# Patient Record
Sex: Female | Born: 1985 | Race: Black or African American | Hispanic: No | Marital: Single | Smoking: Never smoker
Health system: Southern US, Community
[De-identification: ages and names within clinical notes are randomized; demographics above are authoritative.]

## PROBLEM LIST (undated history)

## (undated) ENCOUNTER — Inpatient Hospital Stay (HOSPITAL_COMMUNITY): Payer: Self-pay

## (undated) ENCOUNTER — Emergency Department (HOSPITAL_COMMUNITY): Payer: Medicaid Other

## (undated) DIAGNOSIS — B373 Candidiasis of vulva and vagina: Secondary | ICD-10-CM

## (undated) DIAGNOSIS — B977 Papillomavirus as the cause of diseases classified elsewhere: Secondary | ICD-10-CM

## (undated) DIAGNOSIS — F419 Anxiety disorder, unspecified: Secondary | ICD-10-CM

## (undated) DIAGNOSIS — F32A Depression, unspecified: Secondary | ICD-10-CM

## (undated) DIAGNOSIS — R51 Headache: Secondary | ICD-10-CM

## (undated) DIAGNOSIS — D696 Thrombocytopenia, unspecified: Secondary | ICD-10-CM

## (undated) DIAGNOSIS — IMO0002 Reserved for concepts with insufficient information to code with codable children: Secondary | ICD-10-CM

## (undated) DIAGNOSIS — A749 Chlamydial infection, unspecified: Secondary | ICD-10-CM

## (undated) DIAGNOSIS — O021 Missed abortion: Secondary | ICD-10-CM

## (undated) DIAGNOSIS — N390 Urinary tract infection, site not specified: Secondary | ICD-10-CM

## (undated) DIAGNOSIS — Z332 Encounter for elective termination of pregnancy: Secondary | ICD-10-CM

## (undated) DIAGNOSIS — N760 Acute vaginitis: Secondary | ICD-10-CM

## (undated) DIAGNOSIS — R87619 Unspecified abnormal cytological findings in specimens from cervix uteri: Secondary | ICD-10-CM

## (undated) DIAGNOSIS — D573 Sickle-cell trait: Secondary | ICD-10-CM

## (undated) DIAGNOSIS — F329 Major depressive disorder, single episode, unspecified: Secondary | ICD-10-CM

## (undated) DIAGNOSIS — B9689 Other specified bacterial agents as the cause of diseases classified elsewhere: Secondary | ICD-10-CM

## (undated) DIAGNOSIS — B3731 Acute candidiasis of vulva and vagina: Secondary | ICD-10-CM

## (undated) HISTORY — PX: TOOTH EXTRACTION: SUR596

## (undated) HISTORY — PX: EYE SURGERY: SHX253

---
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
## 2006-07-17 ENCOUNTER — Other Ambulatory Visit: Admission: RE | Admit: 2006-07-17 | Discharge: 2006-07-17 | Payer: Self-pay | Admitting: Obstetrics & Gynecology

## 2007-01-01 ENCOUNTER — Other Ambulatory Visit: Admission: RE | Admit: 2007-01-01 | Discharge: 2007-01-01 | Payer: Self-pay | Admitting: Obstetrics & Gynecology

## 2007-02-12 ENCOUNTER — Other Ambulatory Visit: Admission: RE | Admit: 2007-02-12 | Discharge: 2007-02-12 | Payer: Self-pay | Admitting: Obstetrics & Gynecology

## 2007-07-25 ENCOUNTER — Other Ambulatory Visit: Admission: RE | Admit: 2007-07-25 | Discharge: 2007-07-25 | Payer: Self-pay | Admitting: Obstetrics and Gynecology

## 2007-11-26 ENCOUNTER — Other Ambulatory Visit: Admission: RE | Admit: 2007-11-26 | Discharge: 2007-11-26 | Payer: Self-pay | Admitting: Obstetrics and Gynecology

## 2008-08-10 ENCOUNTER — Other Ambulatory Visit: Admission: RE | Admit: 2008-08-10 | Discharge: 2008-08-10 | Payer: Self-pay | Admitting: Obstetrics and Gynecology

## 2009-02-16 ENCOUNTER — Ambulatory Visit (HOSPITAL_COMMUNITY): Admission: RE | Admit: 2009-02-16 | Discharge: 2009-02-20 | Payer: Self-pay | Admitting: Obstetrics and Gynecology

## 2009-02-16 ENCOUNTER — Inpatient Hospital Stay (HOSPITAL_COMMUNITY): Admission: AD | Admit: 2009-02-16 | Discharge: 2009-02-20 | Payer: Self-pay | Admitting: Obstetrics and Gynecology

## 2009-10-01 ENCOUNTER — Other Ambulatory Visit: Admission: RE | Admit: 2009-10-01 | Discharge: 2009-10-01 | Payer: Self-pay | Admitting: Obstetrics and Gynecology

## 2009-12-31 ENCOUNTER — Emergency Department (HOSPITAL_COMMUNITY): Admission: EM | Admit: 2009-12-31 | Discharge: 2009-12-31 | Payer: Self-pay | Admitting: Emergency Medicine

## 2010-01-02 ENCOUNTER — Emergency Department (HOSPITAL_COMMUNITY): Admission: EM | Admit: 2010-01-02 | Discharge: 2010-01-02 | Payer: Self-pay | Admitting: Emergency Medicine

## 2010-01-31 ENCOUNTER — Emergency Department (HOSPITAL_COMMUNITY): Admission: EM | Admit: 2010-01-31 | Discharge: 2010-01-31 | Payer: Self-pay | Admitting: Emergency Medicine

## 2010-02-04 ENCOUNTER — Emergency Department (HOSPITAL_COMMUNITY): Admission: EM | Admit: 2010-02-04 | Discharge: 2010-02-04 | Payer: Self-pay | Admitting: Emergency Medicine

## 2010-09-05 ENCOUNTER — Encounter: Payer: Self-pay | Admitting: Obstetrics and Gynecology

## 2010-10-04 ENCOUNTER — Other Ambulatory Visit: Payer: Self-pay | Admitting: Obstetrics and Gynecology

## 2010-10-04 ENCOUNTER — Other Ambulatory Visit (HOSPITAL_COMMUNITY)
Admission: RE | Admit: 2010-10-04 | Discharge: 2010-10-04 | Disposition: A | Discharge status: Home / Self Care | Payer: Self-pay | Source: Ambulatory Visit | Attending: Obstetrics and Gynecology | Admitting: Obstetrics and Gynecology

## 2010-10-04 DIAGNOSIS — Z113 Encounter for screening for infections with a predominantly sexual mode of transmission: Secondary | ICD-10-CM | POA: Insufficient documentation

## 2010-10-04 DIAGNOSIS — Z01419 Encounter for gynecological examination (general) (routine) without abnormal findings: Secondary | ICD-10-CM | POA: Insufficient documentation

## 2010-11-20 LAB — CBC
HCT: 28.8 % — ABNORMAL LOW (ref 36.0–46.0)
HCT: 34 % — ABNORMAL LOW (ref 36.0–46.0)
Hemoglobin: 11.6 g/dL — ABNORMAL LOW (ref 12.0–15.0)
Hemoglobin: 12.1 g/dL (ref 12.0–15.0)
Hemoglobin: 9.9 g/dL — ABNORMAL LOW (ref 12.0–15.0)
MCHC: 34.1 g/dL (ref 30.0–36.0)
MCHC: 34.2 g/dL (ref 30.0–36.0)
MCHC: 34.3 g/dL (ref 30.0–36.0)
MCHC: 34.3 g/dL (ref 30.0–36.0)
MCV: 83.9 fL (ref 78.0–100.0)
MCV: 84.1 fL (ref 78.0–100.0)
MCV: 84.1 fL (ref 78.0–100.0)
Platelets: 115 10*3/uL — ABNORMAL LOW (ref 150–400)
Platelets: 120 10*3/uL — ABNORMAL LOW (ref 150–400)
RBC: 3.42 MIL/uL — ABNORMAL LOW (ref 3.87–5.11)
RBC: 4.04 MIL/uL (ref 3.87–5.11)
RBC: 4.24 MIL/uL (ref 3.87–5.11)
RDW: 14.1 % (ref 11.5–15.5)
RDW: 14.3 % (ref 11.5–15.5)
RDW: 14.4 % (ref 11.5–15.5)
RDW: 14.7 % (ref 11.5–15.5)
WBC: 8.4 10*3/uL (ref 4.0–10.5)

## 2010-11-20 LAB — COMPREHENSIVE METABOLIC PANEL
ALT: 14 U/L (ref 0–35)
AST: 24 U/L (ref 0–37)
Alkaline Phosphatase: 153 U/L — ABNORMAL HIGH (ref 39–117)
Calcium: 8.9 mg/dL (ref 8.4–10.5)
GFR calc Af Amer: >60 mL/min (ref >60)
Potassium: 3.7 mEq/L (ref 3.5–5.1)
Sodium: 136 mEq/L (ref 135–145)
Total Protein: 5.9 g/dL — ABNORMAL LOW (ref 6.0–8.3)

## 2010-11-20 LAB — RPR: RPR Ser Ql: NONREACTIVE
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
## 2010-12-27 NOTE — Discharge Summary (Signed)
NAMEANYELI, [name]            ACCOUNT NO.:  [id number]   MEDICAL RECORD NO.:  [id number]          PATIENT TYPE:  INP   LOCATION:  9120                          [facility]ACILITY:  [facility]   PHYSICIAN:  Gerald Leitz, MD          DATE OF BIRTH:  09-26-1985   DATE OF ADMISSION:  02/16/2009  DATE OF DISCHARGE:  02/20/2009                               DISCHARGE SUMMARY   ADMISSION DIAGNOSES:  1. 37-week 2-day intrauterine pregnancy.  2. Oligohydramnios.   DISCHARGE DIAGNOSES:  1. 37-week 2-day intrauterine pregnancy.  2. Oligohydramnios.  3. Thrombocytopenia, most likely gestational.  4. Status post low transverse cesarean section.  5. Nuchal cord x1.  6. Nonreassuring fetal heart rate.   BRIEF HOSPITAL COURSE:  The patient was admitted at the office on February 16, 2009, 37 weeks 2 days intrauterine pregnancy with oligohydramnios.  She received Cervidil for induction.  Once Cervidil was completed, she  was started on Pitocin.  Nonreassuring fetal heart rate was consented  for cesarean section, delivered a live born female infant with Apgar of 6  and 7 and 1 at 5 minutes respectively.  Arterial cord pH of 7.23.  The  infant's weight was 6 pounds 4 ounces.  The patient did well  postoperatively.  Her platelets are decreased slightly to 94 on  admission and were up to 115.  Hemoglobin was 10.6.  She is discharged  home on postop day #3 in stable and improved condition on the following  medications Motrin, Percocet, and Micronor.  She is to follow up in our  office on February 22, 2009, for staple removal and in 6 weeks for  postpartum visit.      Gerald Leitz, MD  Electronically Signed     TC/MEDQ  D:  02/20/2009  T:  02/20/2009  Job:  [id number]

## 2010-12-27 NOTE — Op Note (Signed)
[name], [name]            ACCOUNT NO.:  [id number]   MEDICAL RECORD NO.:  [id number]          PATIENT TYPE:  INP   LOCATION:  9120                          [facility]ILITY[facility]  [facility]   PHYSICIAN:  Gerald Leitz, MD          DATE OF BIRTH:  26-Oct-1985   DATE OF PROCEDURE:  02/17/2009  DATE OF DISCHARGE:                               OPERATIVE REPORT   PREOPERATIVE DIAGNOSES:  1. 37-week 2-day intrauterine pregnancy.  2. Oligohydramnios.  3. Nonreassuring fetal heart rate.  4. Thrombocytopenia.   POSTOPERATIVE DIAGNOSES:  1. 37-week 2-day intrauterine pregnancy.  2. Oligohydramnios.  3. Nonreassuring fetal heart rate.  4. Thrombocytopenia.  5. Nuchal cord x1.  6. Status post low transverse cesarean section.   PROCEDURE:  Low transverse cesarean section.   SURGEON:  Gerald Leitz, MD   ASSISTANT:  None.   ANESTHESIA:  Spinal.   FINDINGS:  Female infant, cephalic presentation, Apgars of 6 and 7 and 1  at 5 minutes respectively.  Arterial cord pH of 7123, weight of 6 pounds  4 ounces.   SPECIMEN:  Placenta.   DISPOSITION:  Specimen to Pathology.   ESTIMATED BLOOD LOSS:  700 mL.   URINE OUTPUT:  300 mL.   FLUIDS:  2500 mL.   COMPLICATIONS:  None.   DESCRIPTION OF PROCEDURE:  The patient was taken to the operating room  where she received spinal anesthesia.  She was prepped and draped in the  usual sterile fashion.  Pfannenstiel skin incision was made with  scalpel, carried down to the underlying layer of fascia.  Fascia was  incised in the midline, incision was extended laterally with Mayo  scissors.  Superior aspect of the fascial incision was grasped with  Kocher clamps, elevated, underlying rectus muscles were dissected with  Mayo scissors.  Superior aspect of fascial incision was grasped with  Kocher clamps, underlying rectus muscle were dissected with Mayo  scissors.  The rectus muscle was separated in the midline.  The  peritoneum was identified and entered bluntly.   Alexis retractor was  placed into the peritoneal cavity.  The vesicouterine peritoneum was  grasped with Russians, tented up, and entered sharply with Metzenbaum  scissors.  The incision was extended laterally and that bladder flap was  created digitally.  Low transverse incision was made with a scalpel.  Infant's head was removed delivered.  Mouth and nose were bulb  suctioned.  Nuchal cord x was reduced, the anterior shoulder and rest  body were delivered.  Cord was clamped x2 and cut.  The infant was  handed off to the awaiting pediatricians.  The placenta was expressed.  The uterus was exteriorized, cleared of all clot and debris.  Uterine  incision was repaired with 0 Vicryl in a running locked fashion.  Second  layer of same suture was used for excellent hemostasis.  Uterus was then  returned to the abdomen.  The abdomen was copiously irrigated.  Excellent hemostasis was noted.  The Alexis retractor was removed.  The  fascia was closed with 0 PDS.  The skin was closed with staples.  Sponge, lap, and needle counts were correct x2.  Two grams of Ancef were  given at cord clamp.  The patient was taken to the recovery room awake  and in stable condition.      Gerald Leitz, MD  Electronically Signed     TC/MEDQ  D:  02/17/2009  T:  02/18/2009  Job:  [id number]

## 2011-02-05 ENCOUNTER — Emergency Department (HOSPITAL_COMMUNITY)
Admission: EM | Admit: 2011-02-05 | Discharge: 2011-02-05 | Disposition: A | Discharge status: Home / Self Care | Payer: Medicaid Other | Source: Home / Self Care | Attending: Emergency Medicine | Admitting: Emergency Medicine

## 2011-02-05 ENCOUNTER — Inpatient Hospital Stay (HOSPITAL_COMMUNITY)
Admission: AD | Admit: 2011-02-05 | Discharge: 2011-02-05 | Disposition: A | Discharge status: Home / Self Care | Payer: Medicaid Other | Source: Ambulatory Visit | Attending: Obstetrics & Gynecology | Admitting: Obstetrics & Gynecology

## 2011-02-05 DIAGNOSIS — N898 Other specified noninflammatory disorders of vagina: Secondary | ICD-10-CM | POA: Insufficient documentation

## 2011-02-05 DIAGNOSIS — B9689 Other specified bacterial agents as the cause of diseases classified elsewhere: Secondary | ICD-10-CM | POA: Insufficient documentation

## 2011-02-05 DIAGNOSIS — R109 Unspecified abdominal pain: Secondary | ICD-10-CM | POA: Insufficient documentation

## 2011-02-05 DIAGNOSIS — O99891 Other specified diseases and conditions complicating pregnancy: Secondary | ICD-10-CM | POA: Insufficient documentation

## 2011-02-05 DIAGNOSIS — N760 Acute vaginitis: Secondary | ICD-10-CM

## 2011-02-05 DIAGNOSIS — A499 Bacterial infection, unspecified: Secondary | ICD-10-CM | POA: Insufficient documentation

## 2011-02-05 DIAGNOSIS — J3489 Other specified disorders of nose and nasal sinuses: Secondary | ICD-10-CM | POA: Insufficient documentation

## 2011-02-05 DIAGNOSIS — J45909 Unspecified asthma, uncomplicated: Secondary | ICD-10-CM | POA: Insufficient documentation

## 2011-02-05 DIAGNOSIS — O2390 Unspecified genitourinary tract infection in pregnancy, unspecified trimester: Secondary | ICD-10-CM | POA: Insufficient documentation

## 2011-02-05 LAB — URINALYSIS, ROUTINE W REFLEX MICROSCOPIC
Bilirubin Urine: NEGATIVE
Ketones, ur: NEGATIVE mg/dL
Nitrite: NEGATIVE
Protein, ur: NEGATIVE mg/dL
pH: 5.5 (ref 5.0–8.0)

## 2011-02-05 LAB — WET PREP, GENITAL

## 2011-02-05 LAB — URINE MICROSCOPIC-ADD ON

## 2011-02-05 LAB — POCT PREGNANCY, URINE: Preg Test, Ur: POSITIVE

## 2011-02-07 LAB — GC/CHLAMYDIA PROBE AMP, GENITAL: GC Probe Amp, Genital: NEGATIVE

## 2011-02-16 LAB — CBC
HCT: 31 % — AB (ref 36–46)
Hemoglobin: 10.5 g/dL — AB (ref 12.0–16.0)
Platelets: 218 K/µL (ref 150–399)

## 2011-02-16 LAB — ANTIBODY SCREEN: Antibody Screen: NEGATIVE

## 2011-02-16 LAB — RPR: RPR: NONREACTIVE

## 2011-02-16 LAB — ABO/RH

## 2011-02-16 LAB — RUBELLA ANTIBODY, IGM: Rubella: IMMUNE

## 2011-02-16 LAB — HIV ANTIBODY (ROUTINE TESTING W REFLEX): HIV: NONREACTIVE

## 2011-02-16 LAB — HEPATITIS B SURFACE ANTIGEN: Hepatitis B Surface Ag: NEGATIVE

## 2011-05-07 ENCOUNTER — Inpatient Hospital Stay (HOSPITAL_COMMUNITY)
Admission: AD | Admit: 2011-05-07 | Discharge: 2011-05-07 | Disposition: A | Discharge status: Home / Self Care | Payer: Medicaid Other | Source: Ambulatory Visit | Attending: Obstetrics and Gynecology | Admitting: Obstetrics and Gynecology

## 2011-05-07 ENCOUNTER — Encounter (HOSPITAL_COMMUNITY): Payer: Self-pay | Admitting: *Deleted

## 2011-05-07 DIAGNOSIS — N390 Urinary tract infection, site not specified: Secondary | ICD-10-CM

## 2011-05-07 DIAGNOSIS — O4690 Antepartum hemorrhage, unspecified, unspecified trimester: Secondary | ICD-10-CM

## 2011-05-07 DIAGNOSIS — B373 Candidiasis of vulva and vagina: Secondary | ICD-10-CM

## 2011-05-07 DIAGNOSIS — B3731 Acute candidiasis of vulva and vagina: Secondary | ICD-10-CM | POA: Insufficient documentation

## 2011-05-07 DIAGNOSIS — O2390 Unspecified genitourinary tract infection in pregnancy, unspecified trimester: Secondary | ICD-10-CM | POA: Insufficient documentation

## 2011-05-07 DIAGNOSIS — O26859 Spotting complicating pregnancy, unspecified trimester: Secondary | ICD-10-CM | POA: Insufficient documentation

## 2011-05-07 HISTORY — DX: Urinary tract infection, site not specified: N39.0

## 2011-05-07 HISTORY — DX: Candidiasis of vulva and vagina: B37.3

## 2011-05-07 HISTORY — DX: Acute candidiasis of vulva and vagina: B37.31

## 2011-05-07 HISTORY — DX: Other specified bacterial agents as the cause of diseases classified elsewhere: B96.89

## 2011-05-07 HISTORY — DX: Other specified bacterial agents as the cause of diseases classified elsewhere: N76.0

## 2011-05-07 LAB — WET PREP, GENITAL

## 2011-05-07 LAB — AMNISURE RUPTURE OF MEMBRANE (ROM) NOT AT ARMC: Amnisure ROM: NEGATIVE

## 2011-05-07 MED ORDER — FLUCONAZOLE 150 MG PO TABS: 150.0000 mg | ORAL_TABLET | Freq: Once | ORAL | Status: AC

## 2011-05-07 NOTE — Progress Notes (Signed)
Intercourse today with pink d/c after.  Has recently been treated for BV, then yeast, then UTI, states that is a common pattern for her.
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
## 2011-05-07 NOTE — Progress Notes (Signed)
Pt G2 P1 at 22.3wks, having small amt of light pink bleeding and vaginal irritation.  Pt denies pain.

## 2011-05-29 ENCOUNTER — Encounter (HOSPITAL_COMMUNITY): Payer: Self-pay | Admitting: *Deleted

## 2011-05-29 ENCOUNTER — Inpatient Hospital Stay (HOSPITAL_COMMUNITY)
Admission: AD | Admit: 2011-05-29 | Discharge: 2011-05-29 | Disposition: A | Discharge status: Home / Self Care | Payer: Medicaid Other | Source: Ambulatory Visit | Attending: Obstetrics and Gynecology | Admitting: Obstetrics and Gynecology

## 2011-05-29 ENCOUNTER — Other Ambulatory Visit: Payer: Self-pay

## 2011-05-29 DIAGNOSIS — O99019 Anemia complicating pregnancy, unspecified trimester: Secondary | ICD-10-CM | POA: Insufficient documentation

## 2011-05-29 DIAGNOSIS — J45901 Unspecified asthma with (acute) exacerbation: Secondary | ICD-10-CM

## 2011-05-29 DIAGNOSIS — D649 Anemia, unspecified: Secondary | ICD-10-CM | POA: Insufficient documentation

## 2011-05-29 DIAGNOSIS — Z889 Allergy status to unspecified drugs, medicaments and biological substances status: Secondary | ICD-10-CM

## 2011-05-29 DIAGNOSIS — O99891 Other specified diseases and conditions complicating pregnancy: Secondary | ICD-10-CM | POA: Insufficient documentation

## 2011-05-29 DIAGNOSIS — J45909 Unspecified asthma, uncomplicated: Secondary | ICD-10-CM | POA: Insufficient documentation

## 2011-05-29 DIAGNOSIS — R079 Chest pain, unspecified: Secondary | ICD-10-CM | POA: Insufficient documentation

## 2011-05-29 HISTORY — DX: Depression, unspecified: F32.A

## 2011-05-29 HISTORY — DX: Major depressive disorder, single episode, unspecified: F32.9

## 2011-05-29 LAB — CBC
Hemoglobin: 8.9 g/dL — ABNORMAL LOW (ref 12.0–15.0)
RBC: 3.58 MIL/uL — ABNORMAL LOW (ref 3.87–5.11)

## 2011-05-29 LAB — URINALYSIS, ROUTINE W REFLEX MICROSCOPIC
Bilirubin Urine: NEGATIVE
Glucose, UA: NEGATIVE mg/dL
Leukocytes, UA: NEGATIVE
Nitrite: NEGATIVE
Specific Gravity, Urine: 1.02 (ref 1.005–1.030)
pH: 7 (ref 5.0–8.0)

## 2011-05-29 LAB — DIFFERENTIAL
Lymphs Abs: 1.2 10*3/uL (ref 0.7–4.0)
Monocytes Relative: 4 % (ref 3–12)
Neutro Abs: 8.7 10*3/uL — ABNORMAL HIGH (ref 1.7–7.7)
Neutrophils Relative %: 83 % — ABNORMAL HIGH (ref 43–77)

## 2011-05-29 MED ORDER — RANITIDINE HCL 150 MG/10ML PO SYRP: 150.0000 mg | ORAL_SOLUTION | Freq: Once | ORAL | Status: DC

## 2011-05-29 MED ORDER — LEVALBUTEROL HCL 0.63 MG/3ML IN NEBU
0.6300 mg | INHALATION_SOLUTION | Freq: Once | RESPIRATORY_TRACT | Status: AC
  Administered 2011-05-29: 0.63 mg via RESPIRATORY_TRACT
  Filled 2011-05-29: qty 3

## 2011-05-29 MED ORDER — INTEGRA PLUS PO CAPS: 1.0000 | ORAL_CAPSULE | Freq: Every day | ORAL | Status: DC

## 2011-05-29 MED ORDER — MONTELUKAST SODIUM 10 MG PO TABS: 10.0000 mg | ORAL_TABLET | Freq: Every day | ORAL | Status: DC

## 2011-05-29 MED ORDER — IBUPROFEN 800 MG PO TABS
800.0000 mg | ORAL_TABLET | Freq: Once | ORAL | Status: AC
  Administered 2011-05-29: 800 mg via ORAL
  Filled 2011-05-29: qty 1

## 2011-05-29 MED ORDER — FAMOTIDINE 20 MG PO TABS
20.0000 mg | ORAL_TABLET | Freq: Once | ORAL | Status: AC
  Administered 2011-05-29: 20 mg via ORAL
  Filled 2011-05-29: qty 1

## 2011-05-29 MED ORDER — DIPHENHYDRAMINE HCL 25 MG PO CAPS: 25.0000 mg | ORAL_CAPSULE | Freq: Four times a day (QID) | ORAL | Status: DC | PRN

## 2011-05-29 MED ORDER — LORATADINE 10 MG PO TABS
10.0000 mg | ORAL_TABLET | Freq: Every day | ORAL | Status: DC
  Administered 2011-05-29: 10 mg via ORAL
  Filled 2011-05-29 (×3): qty 1

## 2011-05-29 NOTE — ED Notes (Signed)
Pt is allergic to pet fur and has been living with the FOB's grandmother and she has a small indoor dog; c/o head congestion for past month and has been exposed to the dog for approximately that same amount of time;

## 2011-05-29 NOTE — Progress Notes (Signed)
C/o chest pain and SOB this Am at school; ? Asthma attack:; head congestion for past month;
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
## 2011-06-07 ENCOUNTER — Encounter (HOSPITAL_COMMUNITY): Payer: Self-pay | Admitting: *Deleted

## 2011-06-07 ENCOUNTER — Inpatient Hospital Stay (HOSPITAL_COMMUNITY)
Admission: AD | Admit: 2011-06-07 | Discharge: 2011-06-07 | Disposition: A | Discharge status: Home / Self Care | Payer: Medicaid Other | Source: Ambulatory Visit | Attending: Obstetrics and Gynecology | Admitting: Obstetrics and Gynecology

## 2011-06-07 ENCOUNTER — Inpatient Hospital Stay (HOSPITAL_COMMUNITY): Payer: Medicaid Other

## 2011-06-07 ENCOUNTER — Other Ambulatory Visit: Payer: Self-pay | Admitting: Obstetrics and Gynecology

## 2011-06-07 DIAGNOSIS — O99891 Other specified diseases and conditions complicating pregnancy: Secondary | ICD-10-CM | POA: Insufficient documentation

## 2011-06-07 DIAGNOSIS — O4690 Antepartum hemorrhage, unspecified, unspecified trimester: Secondary | ICD-10-CM

## 2011-06-07 DIAGNOSIS — R319 Hematuria, unspecified: Secondary | ICD-10-CM | POA: Insufficient documentation

## 2011-06-07 HISTORY — DX: Thrombocytopenia, unspecified: D69.6

## 2011-06-07 LAB — URINE MICROSCOPIC-ADD ON

## 2011-06-07 LAB — CBC
MCH: 24.8 pg — ABNORMAL LOW (ref 26.0–34.0)
MCHC: 32.7 g/dL (ref 30.0–36.0)
Platelets: 164 10*3/uL (ref 150–400)
RDW: 16 % — ABNORMAL HIGH (ref 11.5–15.5)

## 2011-06-07 LAB — URINALYSIS, ROUTINE W REFLEX MICROSCOPIC
Ketones, ur: NEGATIVE mg/dL
Nitrite: NEGATIVE
Protein, ur: NEGATIVE mg/dL
Urobilinogen, UA: 0.2 mg/dL (ref 0.0–1.0)
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
## 2011-06-07 NOTE — Progress Notes (Signed)
Patient states that at 0430 this am she went to the bathroom and had blood on the tissue when wiping and when she stood up bright red blood ran down her legs. Has continued to have bleeding when urinating. Has pain with urination that started the same time this am. Patient states she has had a cold for 2 days with a non-productive cough.

## 2011-06-08 LAB — URINE CULTURE: Colony Count: NO GROWTH

## 2011-06-08 LAB — GC/CHLAMYDIA PROBE AMP, GENITAL: GC Probe Amp, Genital: NEGATIVE

## 2011-06-09 ENCOUNTER — Inpatient Hospital Stay (HOSPITAL_COMMUNITY)
Admission: AD | Admit: 2011-06-09 | Discharge: 2011-06-09 | Disposition: A | Discharge status: Home / Self Care | Payer: Medicaid Other | Source: Ambulatory Visit | Attending: Obstetrics and Gynecology | Admitting: Obstetrics and Gynecology

## 2011-06-09 DIAGNOSIS — D649 Anemia, unspecified: Secondary | ICD-10-CM | POA: Diagnosis present

## 2011-06-09 DIAGNOSIS — O4690 Antepartum hemorrhage, unspecified, unspecified trimester: Secondary | ICD-10-CM | POA: Insufficient documentation

## 2011-06-09 DIAGNOSIS — B3731 Acute candidiasis of vulva and vagina: Secondary | ICD-10-CM | POA: Insufficient documentation

## 2011-06-09 DIAGNOSIS — B373 Candidiasis of vulva and vagina: Secondary | ICD-10-CM | POA: Insufficient documentation

## 2011-06-09 DIAGNOSIS — O2390 Unspecified genitourinary tract infection in pregnancy, unspecified trimester: Secondary | ICD-10-CM | POA: Insufficient documentation

## 2011-06-09 DIAGNOSIS — Z98891 History of uterine scar from previous surgery: Secondary | ICD-10-CM

## 2011-06-09 MED ORDER — PHENAZOPYRIDINE HCL 100 MG PO TABS
200.0000 mg | ORAL_TABLET | Freq: Once | ORAL | Status: AC
  Administered 2011-06-09: 200 mg via ORAL
  Filled 2011-06-09: qty 2

## 2011-06-09 MED ORDER — PHENAZOPYRIDINE HCL 200 MG PO TABS: 200.0000 mg | ORAL_TABLET | Freq: Three times a day (TID) | ORAL | Status: AC

## 2011-06-09 NOTE — ED Provider Notes (Signed)
History     Chief Complaint  Patient presents with  . Vaginal Bleeding  . Dysuria   HPI Comments: Pt is a G4P1 at [redacted]w[redacted]d with c/o urinary burning and pain. And spotting. Pt was seen here 2 days ago and was evaluated for bleeding with negative results. Pt received a catheter to check UA. Pt denies ctx, heavy VB or LOF, +FM.   Vaginal Bleeding Associated symptoms include dysuria.  Dysuria       Past Medical History  Diagnosis Date  . Bacterial vaginosis   . Yeast vaginitis   . UTI (urinary tract infection)   . Asthma   . Depression   . Thrombocytopenia   . Sickle cell disease   . Preterm labor     Past Surgical History  Procedure Date  . Cesarean section   . Eye surgery   . Eye surgery     No family history on file.  History  Substance Use Topics  . Smoking status: Never Smoker   . Smokeless tobacco: Never Used  . Alcohol Use: No    Allergies:  Allergies  Allergen Reactions  . Sulfa Antibiotics Hives and Itching    Prescriptions prior to admission  Medication Sig Dispense Refill  . FeFum-FePoly-FA-B Cmp-C-Biot (INTEGRA PLUS) CAPS Take 1 capsule by mouth daily.  30 capsule  12  . montelukast (SINGULAIR) 10 MG tablet Take 1 tablet (10 mg total) by mouth at bedtime.  30 tablet  12  . prenatal vitamin w/FE, FA (PRENATAL 1 + 1) 27-1 MG TABS Take 1 tablet by mouth daily.        . Probiotic Product (PROBIOTIC FORMULA PO) Take 1 capsule by mouth daily.          Review of Systems  Genitourinary: Positive for dysuria and vaginal bleeding.  All other systems reviewed and are negative.   Physical Exam   Blood pressure 119/59, pulse 78, temperature 97.5 F (36.4 C), temperature source Oral, resp. rate 18, height 5' 4.75" (1.645 m), weight 69.174 kg (152 lb 8 oz).  Physical Exam  Nursing note and vitals reviewed. Constitutional: She is oriented to person, place, and time. She appears well-developed and well-nourished.  Cardiovascular: Normal rate.     Respiratory: Effort normal.  GI: Soft.  Genitourinary: Vaginal discharge found.       White, thick, adherent DC, pt using vag suppository and cream secondary to dx of yeast infection    Musculoskeletal: Normal range of motion.  Neurological: She is alert and oriented to person, place, and time.  Skin: Skin is warm and dry.  Psychiatric: She has a normal mood and affect. Her behavior is normal.    MAU Course  Procedures    Assessment and Plan  IUP at 27wks Yeast infection Likely irritation from catheter  D/C home with pyridium  Manasvini Whatley M 06/09/2011, 11:24 PM

## 2011-06-09 NOTE — Progress Notes (Signed)
"  It hurts down there to urinate.  I think I'm having urinary bleeding.  I have a yeast infection now too.  I have had either BV, yeast and urinary tract infections throughout this pregnancy."

## 2011-06-09 NOTE — Progress Notes (Signed)
Pt states, " I was seen here on 10/24 by Chip Boer. I was having bleeding but she couldn't tell if it was from my vagina or my bladder. Today I started having bleeding again, and it hurts real bad when I pee."

## 2011-06-12 LAB — CULTURE, BETA STREP (GROUP B ONLY): Special Requests: NORMAL

## 2011-07-07 ENCOUNTER — Telehealth: Payer: Self-pay | Admitting: Oncology

## 2011-07-07 NOTE — Telephone Encounter (Signed)
Moved 11/29 new pt appt from DM to FS due to DM overbooked w/2 new pt's. S/w pt today re changed + gv her new time for 11/29 @ 1:30 pm w/FS.

## 2011-07-11 ENCOUNTER — Other Ambulatory Visit: Payer: Self-pay | Admitting: Obstetrics and Gynecology

## 2011-07-12 ENCOUNTER — Encounter: Payer: Self-pay | Admitting: *Deleted

## 2011-07-12 NOTE — Progress Notes (Signed)
This encounter was created in error - please disregard.

## 2011-07-13 ENCOUNTER — Ambulatory Visit: Payer: Medicaid Other

## 2011-07-13 ENCOUNTER — Encounter: Payer: Self-pay | Admitting: *Deleted

## 2011-07-13 ENCOUNTER — Other Ambulatory Visit: Payer: Medicaid Other | Admitting: Lab

## 2011-07-13 ENCOUNTER — Other Ambulatory Visit (HOSPITAL_BASED_OUTPATIENT_CLINIC_OR_DEPARTMENT_OTHER): Payer: Medicaid Other | Admitting: Lab

## 2011-07-13 ENCOUNTER — Encounter: Payer: Medicaid Other | Admitting: Oncology

## 2011-07-13 ENCOUNTER — Ambulatory Visit: Payer: Medicaid Other | Admitting: Oncology

## 2011-07-13 DIAGNOSIS — D649 Anemia, unspecified: Secondary | ICD-10-CM

## 2011-07-13 LAB — CBC & DIFF AND RETIC
Basophils Absolute: 0 10*3/uL (ref 0.0–0.1)
EOS%: 1.9 % (ref 0.0–7.0)
Eosinophils Absolute: 0.2 10*3/uL (ref 0.0–0.5)
HGB: 8.3 g/dL — ABNORMAL LOW (ref 11.6–15.9)
LYMPH%: 14.4 % (ref 14.0–49.7)
MCH: 22.9 pg — ABNORMAL LOW (ref 25.1–34.0)
MCV: 71 fL — ABNORMAL LOW (ref 79.5–101.0)
MONO%: 4.6 % (ref 0.0–14.0)
NEUT#: 6.9 10*3/uL — ABNORMAL HIGH (ref 1.5–6.5)
Platelets: 169 10*3/uL (ref 145–400)
RBC: 3.62 10*6/uL — ABNORMAL LOW (ref 3.70–5.45)
RDW: 16.9 % — ABNORMAL HIGH (ref 11.2–14.5)
Retic Ct Abs: 85.43 10*3/uL (ref 33.70–90.70)

## 2011-07-13 LAB — IRON AND TIBC
%SAT: 3 % — ABNORMAL LOW (ref 20–55)
Iron: 14 ug/dL — ABNORMAL LOW (ref 42–145)
TIBC: 466 ug/dL (ref 250–470)
UIBC: 452 ug/dL — ABNORMAL HIGH (ref 125–400)

## 2011-07-13 LAB — CHCC SMEAR

## 2011-07-13 NOTE — Progress Notes (Signed)
Addended by: Reesa Chew on: 07/13/2011 03:14 PM   Modules accepted: Kipp Brood

## 2011-07-13 NOTE — Progress Notes (Signed)
Addended by: Benjiman Core on: 07/13/2011 03:04 PM   Modules accepted: Orders, Level of Service

## 2011-07-13 NOTE — Progress Notes (Signed)
Note dictated

## 2011-07-14 NOTE — Progress Notes (Signed)
CC:   Osborn Coho, M.D.  REASON FOR CONSULTATION:  Anemia.  HISTORY OF PRESENT ILLNESS:  This is a pleasant 25 year old [place] American female, native of [place]. She is a relatively healthy woman, currently in a 2nd pregnancy.  Her 1st pregnancy about 2 years ago was. She gave birth without any complication. She was mildly anemic at that time with a hemoglobin around 10-11.  She had normal MCV, really no complications to speak of.  Now she is about 8 months in her 2nd pregnancy and has noted to be more progressively anemic at this time. Her most recent laboratory data on 10/24 showed a hemoglobin was 8.4, white count of 8.5, platelet count 164, and MCV was 75, and her hemoglobin was around between 9 and 10 in July of 2010, as mentioned. She although had not been known to have any hemoglobinopathies. She does reportedly have had sickle cell trait.  In terms of her pregnancy at this time, she is not reporting any major complication. She is quite a bit fatigued at times.  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 She is caring for her 59-year-old child and able to maintain her activities of daily living without any major issues. She is not actually reporting any bleeding.  She is not  reporting any hematochezia. Not reporting any melena.  Not reporting any epistaxis. Not reporting any chest pain. Not reporting any shortness of breath.  REVIEW OF SYSTEMS:  Not reporting any headaches, blurred vision, or double vision. Not reporting any motor or sensory neuropathy. Not reporting any alteration in mental status. Not reporting any psychiatric issues, depression. Not reporting any fever, chills, or sweats. Not reporting any cough, hemoptysis, hematemesis, nausea or vomiting, abdominal pain, hematochezia, or melena.  The rest of the review of systems is unremarkable.  PAST MEDICAL HISTORY:  Is significant for history of anemia.  She has history of asthma, allergy.  She  has a history of cesarean section.  MEDICATIONS:  She is on Integra plus. She is on Singulair, on prenatal vitamins, and probiotics.  ALLERGIES:  To sulfa.  SOCIAL HISTORY:  She is single.  She lives with her mother and her child. Denied any alcohol or tobacco abuse.  FAMILY HISTORY:  Emelia Loron has sickle cell.  She is a sickle cell carrier.  PHYSICAL EXAMINATION:  General: Alert, awake, and pleasant woman appeared in no active distress.  Vital signs: Her blood pressure is 109/64, pulse 96, respiration 20, and temperature is 97.6.  HEENT:  Head is normocephalic, atraumatic.  Pupils are equal, round, and reactive to light.  Oral mucosa moist and pink.  Neck:  Supple without lymphadenopathy.  Heart is regular rate and rhythm.  S1 and S2.  Lungs are clear auscultation.  Abdomen is soft, nontender.  No hepatosplenomegaly.  Extremities: No clubbing, cyanosis, or edema. Neurologically intact motor, sensory, and deep tendon reflexes.  LABORATORY DATA:  Showed a hemoglobin of 8.3, white cell count of 8.8, and platelet count 169.  Her MCV is 71, RDW 16.9.  Peripheral smear was personally reviewed today and showed evidence of microcytosis and hypochromia.  Could not really see any evidence of any schistocytosis or red cell fragmentation.  There is no evidence of any target cells to suggest a hemoglobinopathy.  ASSESSMENT AND PLAN:  A 25 year old woman with the following issues: 1. Microcytic hypochromic anemia. Undoubtedly, she has an element of     iron deficiency.  I am checking her iron stores today with  ferritin     and iron levels, and certainly that will document that, but I would     be surprised if this is not all iron deficiency.  She does report     heavy menstrual cycles plus her pregnancy state, I think couple     that to the fact that her 2nd pregnancy she probably was deficient     from iron the 1st pregnancy, all of that culminates to have her     hemoglobin 8.3.  I  discussed the treatment options at this point.     She is on iron supplement through the mouth, which I think is     inadequate at this time.  She is reporting some constipation     associated with it, and I advised her to use stool softeners as a     way to circumvent that. The option of IV iron was discussed     thoroughly today with Ms. Madilyn Fireman. I feel that the safest formulation     would be IV Venofer, but even with that there is always a risk of     infusion-related toxicity such as an allergic reaction, possible     anaphylaxis which could put her fetus in jeopardy. At this time, I     feel that although her degree of anemia is significant, I think she     is relatively asymptomatic from it.  I feel that from my     standpoint, the risk of IV infusion outweighs the potential     benefit.  However, if her anemia does progress further there is     always a possibility of doing packed red cell transfusion, in     anticipation of her C-section which is being scheduled for     somewhere in January 2013.  At this time, I felt whether it is IV     iron or a packed red cell transfusion is needed by her primary     OB/GYN, I think those are both reasonable options. Again, I favor     doing neither but if needed to, I think I would go with packed red     cell transfusion as an easier and probably safer option and could     be done in preparation for her C-section or on the day of.  I also     talked to Ms. Gentzler about a followup after she gave birth, and I     will remeasure her iron stores at that time and certainly     reintroduce the idea of IV iron then, after she has given birth     which would probably be a lot safer to give at that time.  All her     questions were answered today, and we will set up followup after     she gives birth.  Should you have any questions, please do not     hesitate to contact me regarding her case.  Thank you for the      referral.    ______________________________ Benjiman Core, M.D. FNS/MEDQ  D:  07/13/2011  T:  07/13/2011  Job:  [id number]

## 2011-07-18 ENCOUNTER — Encounter (HOSPITAL_COMMUNITY): Payer: Self-pay | Admitting: *Deleted

## 2011-07-18 ENCOUNTER — Inpatient Hospital Stay (HOSPITAL_COMMUNITY)
Admission: AD | Admit: 2011-07-18 | Discharge: 2011-07-19 | Disposition: A | Discharge status: Home / Self Care | Payer: Medicaid Other | Source: Ambulatory Visit | Attending: Obstetrics and Gynecology | Admitting: Obstetrics and Gynecology

## 2011-07-18 ENCOUNTER — Other Ambulatory Visit: Payer: Self-pay | Admitting: Obstetrics and Gynecology

## 2011-07-18 DIAGNOSIS — D573 Sickle-cell trait: Secondary | ICD-10-CM

## 2011-07-18 DIAGNOSIS — O09299 Supervision of pregnancy with other poor reproductive or obstetric history, unspecified trimester: Secondary | ICD-10-CM

## 2011-07-18 DIAGNOSIS — D693 Immune thrombocytopenic purpura: Secondary | ICD-10-CM

## 2011-07-18 DIAGNOSIS — R51 Headache: Secondary | ICD-10-CM | POA: Insufficient documentation

## 2011-07-18 DIAGNOSIS — R87619 Unspecified abnormal cytological findings in specimens from cervix uteri: Secondary | ICD-10-CM

## 2011-07-18 DIAGNOSIS — O139 Gestational [pregnancy-induced] hypertension without significant proteinuria, unspecified trimester: Secondary | ICD-10-CM

## 2011-07-18 DIAGNOSIS — O99891 Other specified diseases and conditions complicating pregnancy: Secondary | ICD-10-CM | POA: Insufficient documentation

## 2011-07-18 DIAGNOSIS — J45909 Unspecified asthma, uncomplicated: Secondary | ICD-10-CM

## 2011-07-18 DIAGNOSIS — O4692 Antepartum hemorrhage, unspecified, second trimester: Secondary | ICD-10-CM

## 2011-07-18 HISTORY — DX: Sickle-cell trait: D57.3

## 2011-07-18 LAB — URINALYSIS, ROUTINE W REFLEX MICROSCOPIC
Nitrite: NEGATIVE
Specific Gravity, Urine: 1.025 (ref 1.005–1.030)
Urobilinogen, UA: 0.2 mg/dL (ref 0.0–1.0)
pH: 5.5 (ref 5.0–8.0)

## 2011-07-18 LAB — COMPREHENSIVE METABOLIC PANEL WITH GFR
ALT: 10 U/L (ref 0–35)
AST: 20 U/L (ref 0–37)
Albumin: 2.4 g/dL — ABNORMAL LOW (ref 3.5–5.2)
Alkaline Phosphatase: 137 U/L — ABNORMAL HIGH (ref 39–117)
BUN: 3 mg/dL — ABNORMAL LOW (ref 6–23)
CO2: 21 meq/L (ref 19–32)
Calcium: 9 mg/dL (ref 8.4–10.5)
Chloride: 101 meq/L (ref 96–112)
Creatinine, Ser: 0.53 mg/dL (ref 0.50–1.10)
GFR calc Af Amer: >90 mL/min
GFR calc non Af Amer: >90 mL/min
Glucose, Bld: 115 mg/dL — ABNORMAL HIGH (ref 70–99)
Potassium: 3.5 meq/L (ref 3.5–5.1)
Sodium: 134 meq/L — ABNORMAL LOW (ref 135–145)
Total Bilirubin: 0.3 mg/dL (ref 0.3–1.2)
Total Protein: 6.1 g/dL (ref 6.0–8.3)

## 2011-07-18 LAB — CBC
HCT: 26.1 % — ABNORMAL LOW (ref 36.0–46.0)
Hemoglobin: 8.3 g/dL — ABNORMAL LOW (ref 12.0–15.0)
MCH: 23 pg — ABNORMAL LOW (ref 26.0–34.0)
MCHC: 31.8 g/dL (ref 30.0–36.0)
MCV: 72.3 fL — ABNORMAL LOW (ref 78.0–100.0)
Platelets: 201 K/uL (ref 150–400)
RBC: 3.61 MIL/uL — ABNORMAL LOW (ref 3.87–5.11)
RDW: 17.1 % — ABNORMAL HIGH (ref 11.5–15.5)
WBC: 10.2 K/uL (ref 4.0–10.5)

## 2011-07-18 LAB — URINE MICROSCOPIC-ADD ON

## 2011-07-18 LAB — LACTATE DEHYDROGENASE: LDH: 213 U/L (ref 94–250)

## 2011-07-18 LAB — URIC ACID: Uric Acid, Serum: 5.5 mg/dL (ref 2.4–7.0)

## 2011-07-18 MED ORDER — ONDANSETRON 8 MG PO TBDP: 8.0000 mg | ORAL_TABLET | Freq: Three times a day (TID) | ORAL | Status: AC | PRN

## 2011-07-18 MED ORDER — ACETAMINOPHEN 500 MG PO TABS
1000.0000 mg | ORAL_TABLET | Freq: Once | ORAL | Status: AC
  Administered 2011-07-18: 1000 mg via ORAL
  Filled 2011-07-18: qty 2

## 2011-07-18 MED ORDER — DEXTROSE 5 % IN LACTATED RINGERS IV BOLUS
500.0000 mL | Freq: Once | INTRAVENOUS | Status: AC
  Administered 2011-07-18: 500 mL via INTRAVENOUS

## 2011-07-18 MED ORDER — DEXTROSE IN LACTATED RINGERS 5 % IV SOLN
INTRAVENOUS | Status: DC
  Administered 2011-07-18: 22:00:00 via INTRAVENOUS

## 2011-07-18 MED ORDER — NALBUPHINE SYRINGE 5 MG/0.5 ML
10.0000 mg | INJECTION | Freq: Four times a day (QID) | INTRAMUSCULAR | Status: DC | PRN
  Filled 2011-07-18: qty 1

## 2011-07-18 MED ORDER — ONDANSETRON HCL 4 MG/2ML IJ SOLN
4.0000 mg | Freq: Once | INTRAMUSCULAR | Status: AC
  Administered 2011-07-18: 4 mg via INTRAVENOUS
  Filled 2011-07-18: qty 2

## 2011-07-18 MED ORDER — NALBUPHINE SYRINGE 5 MG/0.5 ML
5.0000 mg | INJECTION | Freq: Once | INTRAMUSCULAR | Status: AC
  Administered 2011-07-18: 5 mg via INTRAVENOUS
  Filled 2011-07-18: qty 0.5

## 2011-07-18 NOTE — Progress Notes (Signed)
Patient states she started having a temporal headache bilaterally. Also has a little lower abdominal pain with walking. Has had decreased fetal movement today, has felt some movement just less than usual. No leaking or bleeding.

## 2011-07-18 NOTE — Progress Notes (Signed)
S: no improvement in HA O:  Results for orders placed during the hospital encounter of 07/18/11 (from the past 24 hour(s))  URINALYSIS, ROUTINE W REFLEX MICROSCOPIC     Status: Abnormal   Collection Time   07/18/11  7:50 PM      Component Value Range   Color, Urine YELLOW  YELLOW    APPearance CLEAR  CLEAR    Specific Gravity, Urine 1.025  1.005 - 1.030    pH 5.5  5.0 - 8.0    Glucose, UA NEGATIVE  NEGATIVE (mg/dL)   Hgb urine dipstick TRACE (*) NEGATIVE    Bilirubin Urine NEGATIVE  NEGATIVE    Ketones, ur >80 (*) NEGATIVE (mg/dL)   Protein, ur NEGATIVE  NEGATIVE (mg/dL)   Urobilinogen, UA 0.2  0.0 - 1.0 (mg/dL)   Nitrite NEGATIVE  NEGATIVE    Leukocytes, UA MODERATE (*) NEGATIVE   URINE MICROSCOPIC-ADD ON     Status: Abnormal   Collection Time   07/18/11  7:50 PM      Component Value Range   Squamous Epithelial / LPF FEW (*) RARE    WBC, UA 11-20  <3 (WBC/hpf)   RBC / HPF 0-2  <3 (RBC/hpf)   Bacteria, UA FEW (*) RARE    Urine-Other RARE YEAST      A: r/o PIH P: PIH labs IVF's D5LR 500cc bolus, then 125/hour nubain 5mg  IVP

## 2011-07-18 NOTE — ED Provider Notes (Signed)
History     Chief Complaint  Patient presents with  . Headache   HPI Comments: Pt is a [id number] at [redacted]w[redacted]d that arrives today with cc of HA, and difficulty seeing. She states she's been having swelling for 3 weeks. She also c/o "abdominal pain" and points to suprapubic area. States they do not feel like ctx. She denies N/V/RUQ pain. She admits to not having anything to eat or drink since this AM and has not tried any meds for her HA. She denies any VB, LOF,and reports +FM. Pregnancy significant for: 1. Asthma 2. Hx oligo 3. Hx prim c/s - desires repeat 4. abnl pap - colpo 5. Hx ct 6. Hx 2nd trimester yeast infection/BV 7. Hx 2nd trimester bleeding 8. SCT 9. Anemia 10. 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 Pertinent negatives include no blurred vision, dizziness, eye pain, eye redness, nausea or vomiting.      Past Medical History  Diagnosis Date  . Bacterial vaginosis   . Yeast vaginitis   . UTI (urinary tract infection)   . Asthma   . Thrombocytopenia   . Sickle cell disease   . Preterm labor   . Sickle cell trait     Past Surgical History  Procedure Date  . Cesarean section   . Eye surgery   . Eye surgery     No family history on file.  History  Substance Use Topics  . Smoking status: Never Smoker   . Smokeless tobacco: Never Used  . Alcohol Use: No    Allergies:  Allergies  Allergen Reactions  . Sulfa Antibiotics Hives and Itching    Prescriptions prior to admission  Medication Sig Dispense Refill  . ferrous gluconate (FERGON) 325 MG tablet Take 325 mg by mouth daily with breakfast.        . metroNIDAZOLE (FLAGYL) 500 MG tablet Take 500 mg by mouth 2 (two) times daily. Patient got filled on 07-05-11, take 1 tablet twice a day for 7 days       . prenatal vitamin w/FE, FA (PRENATAL 1 + 1) 27-1 MG TABS Take 1 tablet by mouth daily.        . Probiotic Product (PROBIOTIC FORMULA PO) Take 1 capsule by  mouth daily.        Marland Kitchen terconazole (TERAZOL 3) 80 MG vaginal suppository Place 80 mg vaginally at bedtime.          Review of Systems  Eyes: Positive for photophobia. Negative for blurred vision, double vision, pain, discharge and redness.  Cardiovascular: Negative for chest pain.  Gastrointestinal: Positive for abdominal pain. Negative for heartburn, nausea and vomiting.       Cramping?  Genitourinary: Positive for dysuria. Negative for urgency, frequency and hematuria.       Sates she felt burning when getting CCUA today  Neurological: Positive for headaches. Negative for dizziness.  All other systems reviewed and are negative.   Physical Exam   Blood pressure 117/70, pulse 79, temperature 98.7 F (37.1 C), temperature source Oral, resp. rate 20, height 5\' 5"  (1.651 m), weight 72.394 kg (159 lb 9.6 oz), SpO2 99.00%.  Physical Exam  Constitutional: She is oriented to person, place, and time. She appears well-developed and well-nourished.  Neck: Normal range of motion.  Cardiovascular: Normal rate.   Respiratory: Effort normal.  GI: Soft. She exhibits no distension. There is no tenderness. There is no rebound.  Genitourinary: Vagina normal.  Musculoskeletal: Normal  range of motion. She exhibits no edema.  Neurological: She is alert and oriented to person, place, and time. She has normal reflexes.  Skin: Skin is warm and dry.  Psychiatric: She has a normal mood and affect. Her behavior is normal.   VE=cl/th/high  MAU Course  Procedures    Assessment and Plan  IUP at [redacted]w[redacted]d HA - likely from lack of food/fluids No evidence of PTL BP normal  Tylenol 1g PO PO hydration CCUA  Akia Montalban M 07/18/2011, 8:14 PM

## 2011-07-19 ENCOUNTER — Encounter: Payer: Self-pay | Admitting: Obstetrics and Gynecology

## 2011-07-19 LAB — POCT PREGNANCY, URINE: Preg Test, Ur: POSITIVE

## 2011-07-19 NOTE — Progress Notes (Signed)
S: feels drowsy after nubain, ha is better, no nausea O: FHR 130 reactive, cat I,  toco quiet PIH labs nl  Filed Vitals:   07/18/11 2302 07/18/11 2312 07/18/11 2327 07/18/11 2332  BP: 99/43 92/44 95/46  111/58  Pulse: 79 85 82 82  Temp:      TempSrc:      Resp:      Height:      Weight:      SpO2:        A: no sx's PIH HA resolved  P: D/C home F/U routine

## 2011-07-29 ENCOUNTER — Inpatient Hospital Stay (HOSPITAL_COMMUNITY)
Admission: AD | Admit: 2011-07-29 | Discharge: 2011-07-29 | Disposition: A | Discharge status: Home / Self Care | Payer: Medicaid Other | Source: Ambulatory Visit | Attending: Obstetrics and Gynecology | Admitting: Obstetrics and Gynecology

## 2011-07-29 ENCOUNTER — Encounter (HOSPITAL_COMMUNITY): Payer: Self-pay | Admitting: Obstetrics and Gynecology

## 2011-07-29 DIAGNOSIS — R51 Headache: Secondary | ICD-10-CM | POA: Insufficient documentation

## 2011-07-29 DIAGNOSIS — R112 Nausea with vomiting, unspecified: Secondary | ICD-10-CM

## 2011-07-29 DIAGNOSIS — O212 Late vomiting of pregnancy: Secondary | ICD-10-CM | POA: Insufficient documentation

## 2011-07-29 LAB — URINALYSIS, ROUTINE W REFLEX MICROSCOPIC
Bilirubin Urine: NEGATIVE
Hgb urine dipstick: NEGATIVE
Nitrite: NEGATIVE
Protein, ur: NEGATIVE mg/dL
Urobilinogen, UA: 1 mg/dL (ref 0.0–1.0)

## 2011-07-29 LAB — URINE MICROSCOPIC-ADD ON

## 2011-07-29 MED ORDER — ACETAMINOPHEN 325 MG PO TABS
650.0000 mg | ORAL_TABLET | Freq: Four times a day (QID) | ORAL | Status: DC | PRN
  Administered 2011-07-29: 650 mg via ORAL
  Filled 2011-07-29: qty 2

## 2011-07-29 NOTE — ED Provider Notes (Signed)
History     Chief Complaint  Patient presents with  . Emesis  . Headache   HPI Pt is a 25 yo G3 P 0,1,1,0 who presents to MAU with c/o of sudden onset of nausea, vomiting, headache, dizziness and loss of vision this AM as well as some lower abdominal cramping and back pain.  Reports symptoms began this AM after eating and taking metronidazole.  She has taken this medication in the past without side effects.  Reports she had 4 episodes of vomiting in a short period of time which have now resolved.  She reports she no longer has dizziness and her vision has returned to normal.  She reports the symptoms lasted approximately 20 minutes. She has not treated her headache.  She now is requesting food and fluids.  She ranks her headache as 5/10 on scale.  She denies any of the symptoms prior to vomiting and denies nausea prior to taking metronidazole.  She denies phono or photophobia.  She reports her fetus is moving normally.  She reports some low back pain and intermittent cramping but unsure of frequency.  She reports this began after having intercourse last PM.  She denies ROM or bldg.    OB History    Grav Para Term Preterm Abortions TAB SAB Ect Mult Living   3 1  1 1  1   1       Past Medical History  Diagnosis Date  . Bacterial vaginosis   . Yeast vaginitis   . UTI (urinary tract infection)   . Asthma   . Thrombocytopenia   . Sickle cell disease   . Preterm labor   . Sickle cell trait     Past Surgical History  Procedure Date  . Cesarean section   . Eye surgery   . Eye surgery     Family History  Problem Relation Age of Onset  . Cancer Paternal Uncle   . Cancer Maternal Grandmother   . Cancer Paternal Grandfather     History  Substance Use Topics  . Smoking status: Never Smoker   . Smokeless tobacco: Never Used  . Alcohol Use: No    Allergies:  Allergies  Allergen Reactions  . Sulfa Antibiotics Hives and Itching    Prescriptions prior to admission  Medication  Sig Dispense Refill  . ferrous gluconate (FERGON) 325 MG tablet Take 325 mg by mouth daily with breakfast.        . metroNIDAZOLE (FLAGYL) 500 MG tablet Take 500 mg by mouth 2 (two) times daily. Patient got filled on 07-05-11, take 1 tablet twice a day for 7 days       . prenatal vitamin w/FE, FA (PRENATAL 1 + 1) 27-1 MG TABS Take 1 tablet by mouth daily.        Marland Kitchen terconazole (TERAZOL 3) 80 MG vaginal suppository Place 80 mg vaginally at bedtime.          Review of Systems  Constitutional: Negative.   HENT: Negative.   Eyes: Negative.   Respiratory: Negative.   Cardiovascular: Negative.   Gastrointestinal: Positive for nausea, vomiting and constipation.       Increased gasiness  Genitourinary: Negative.   Musculoskeletal: Negative.   Skin: Negative.   Neurological: Negative.   Endo/Heme/Allergies: Negative.   Psychiatric/Behavioral: Negative.    Physical Exam   Blood pressure 103/68, pulse 97, temperature 98.7 F (37.1 C), temperature source Oral, resp. rate 18, height 5\' 4"  (1.626 m), weight 73.029 kg (161 lb), SpO2  97.00%. Filed Vitals:   07/29/11 1115 07/29/11 1152  BP: 104/64 103/68  Pulse: 97   Temp: 98.7 F (37.1 C)   TempSrc: Oral   Resp: 20 18  Height: 5\' 4"  (1.626 m)   Weight: 73.029 kg (161 lb)   SpO2: 97%    Physical Exam  Constitutional: She is oriented to person, place, and time. She appears well-developed.  HENT:  Head: Normocephalic and atraumatic.  Right Ear: External ear normal.  Left Ear: External ear normal.  Eyes: Conjunctivae and EOM are normal. Pupils are equal, round, and reactive to light.  Neck: Normal range of motion. Neck supple. No thyromegaly present.  Cardiovascular: Normal rate, regular rhythm and intact distal pulses.        Neg edema noted  Respiratory: Effort normal and breath sounds normal.  GI: Soft. Bowel sounds are normal.  Genitourinary: Vagina normal and uterus normal.       Ut gravid, non-tender  Musculoskeletal: Normal  range of motion.  Neurological: She is alert and oriented to person, place, and time. She has normal reflexes.  Skin: Skin is warm and dry.  Psychiatric: She has a normal mood and affect. Her behavior is normal. Judgment and thought content normal.   FHR baseline 135 bpm.  Moderate variability present.  Accels present.  No decels present.   Toco with rare uterine contraction noted. SVE deferred.  MAU Course  Procedures Results for orders placed during the hospital encounter of 07/29/11 (from the past 24 hour(s))  URINALYSIS, ROUTINE W REFLEX MICROSCOPIC     Status: Abnormal   Collection Time   07/29/11 11:20 AM      Component Value Range   Color, Urine YELLOW  YELLOW    APPearance HAZY (*) CLEAR    Specific Gravity, Urine 1.020  1.005 - 1.030    pH 7.0  5.0 - 8.0    Glucose, UA NEGATIVE  NEGATIVE (mg/dL)   Hgb urine dipstick NEGATIVE  NEGATIVE    Bilirubin Urine NEGATIVE  NEGATIVE    Ketones, ur NEGATIVE  NEGATIVE (mg/dL)   Protein, ur NEGATIVE  NEGATIVE (mg/dL)   Urobilinogen, UA 1.0  0.0 - 1.0 (mg/dL)   Nitrite NEGATIVE  NEGATIVE    Leukocytes, UA SMALL (*) NEGATIVE   URINE MICROSCOPIC-ADD ON     Status: Abnormal   Collection Time   07/29/11 11:20 AM      Component Value Range   Squamous Epithelial / LPF MANY (*) RARE    WBC, UA 3-6  <3 (WBC/hpf)   RBC / HPF 0-2  <3 (RBC/hpf)   Bacteria, UA RARE  RARE   GLUCOSE, CAPILLARY     Status: Normal   Collection Time   07/29/11 11:30 AM      Component Value Range   Glucose-Capillary 77  70 - 99 (mg/dL)    Assessment and Plan  IUP at 34w 4d Vomiting likely secondary to medication side effect Headache  Consult with Dr. Stefano Gaul.  Will treat headache with Tylenol and give clear liquids.  If no further issues and headache improves, will discharge to home.  After tylenol, pt reports no further headache as well as no further nausea or vomiting and requests to eat.  Pt discharged to home with instructions reviewed.  RTO on 08/03/11  as scheduled.  Artemisia Auvil O. 07/29/2011, 12:59 PM

## 2011-07-29 NOTE — Progress Notes (Signed)
Pt presents to MAU witt chief complaint of nausea/vomiting. Pt states she had intercourse last night and started contracting. Pt woke up this morning with vomiting; says she vomiting 4 times today. Say she feel really week and has a HA. Pt says the back pain and abdominal pain is intermittent.

## 2011-07-29 NOTE — Progress Notes (Addendum)
Pt reports having N/V since 0930 vomited 4 times. Reprots having a bad headache now. Pt reports being on flagyl for BV. Started on Monday. Reports not taking it regularly keeps forgeting. Reports she took it this morning and then started throwing up.

## 2011-08-22 ENCOUNTER — Encounter (HOSPITAL_COMMUNITY): Payer: Self-pay | Admitting: *Deleted

## 2011-08-22 ENCOUNTER — Inpatient Hospital Stay (HOSPITAL_COMMUNITY)
Admission: RE | Admit: 2011-08-22 | Discharge: 2011-08-22 | Disposition: A | Discharge status: Home / Self Care | Payer: Medicaid Other | Source: Ambulatory Visit | Attending: Obstetrics and Gynecology | Admitting: Obstetrics and Gynecology

## 2011-08-22 ENCOUNTER — Inpatient Hospital Stay (HOSPITAL_COMMUNITY): Payer: Medicaid Other

## 2011-08-22 DIAGNOSIS — O2390 Unspecified genitourinary tract infection in pregnancy, unspecified trimester: Secondary | ICD-10-CM | POA: Insufficient documentation

## 2011-08-22 DIAGNOSIS — O99891 Other specified diseases and conditions complicating pregnancy: Secondary | ICD-10-CM | POA: Insufficient documentation

## 2011-08-22 DIAGNOSIS — B3731 Acute candidiasis of vulva and vagina: Secondary | ICD-10-CM | POA: Insufficient documentation

## 2011-08-22 DIAGNOSIS — O288 Other abnormal findings on antenatal screening of mother: Secondary | ICD-10-CM

## 2011-08-22 DIAGNOSIS — B373 Candidiasis of vulva and vagina: Secondary | ICD-10-CM | POA: Insufficient documentation

## 2011-08-22 LAB — WET PREP, GENITAL
Clue Cells Wet Prep HPF POC: NONE SEEN
Trich, Wet Prep: NONE SEEN

## 2011-08-22 LAB — URINALYSIS, ROUTINE W REFLEX MICROSCOPIC
Glucose, UA: NEGATIVE mg/dL
Hgb urine dipstick: NEGATIVE
Protein, ur: NEGATIVE mg/dL
Specific Gravity, Urine: 1.01 (ref 1.005–1.030)
Urobilinogen, UA: 1 mg/dL (ref 0.0–1.0)

## 2011-08-22 LAB — URINE MICROSCOPIC-ADD ON

## 2011-08-22 MED ORDER — TERCONAZOLE 0.4 % VA CREA: 1.0000 | TOPICAL_CREAM | Freq: Every day | VAGINAL | Status: DC

## 2011-08-22 NOTE — Progress Notes (Signed)
Patient states she has had a watery clear vaginal discharge since 1-2. No bleeding and reports good fetal movement. Patient is scheduled for a repeat cesarean section on 1-21. Some lower back pain.

## 2011-08-22 NOTE — ED Provider Notes (Signed)
History   [name] is a 26 y.o. Black female at 37.5 weeks per [facility] 09/07/11, who presents for r/o ROM.  Reports questionable LOF over the last week, with underwear feeling damp.  No gushes.  No VB.  Does c/o vaginal itching.  No intercourse for over a week.  Reports GFM, No UTI or PIH s/s.  No fever, chills, resp, or GI c/o's.  Asking to eat on arrival to her MAU room.  Ate last around 1100, and only meal today.  Inadequate H2O today as well. Of significance, pt treated for BV approximately one month ago, and reports "you know I can't take Flagyl anymore?"   Pregnancy r/f:  1.  Prev c/s  2.  Linda trait  3.  GBS pos  4.  H/o Oligo  5.  H/o thrombocytopenia 6.  Asthma as a child Chief Complaint  Patient presents with  . Rupture of Membranes   HPI  OB History    Grav Para Term Preterm Abortions TAB SAB Ect Mult Living   3 1  1 1  1   1       Past Medical History  Diagnosis Date  . Bacterial vaginosis   . Yeast vaginitis   . UTI (urinary tract infection)   . Asthma   . Thrombocytopenia   . Sickle cell disease   . Preterm labor   . Sickle cell trait     Past Surgical History  Procedure Date  . Cesarean section   . Eye surgery   . Eye surgery     Family History  Problem Relation Age of Onset  . Cancer Paternal Uncle   . Cancer Maternal Grandmother   . Cancer Paternal Grandfather     History  Substance Use Topics  . Smoking status: Never Smoker   . Smokeless tobacco: Never Used  . Alcohol Use: No    Allergies:  Allergies  Allergen Reactions  . Sulfa Antibiotics Hives and Itching    Prescriptions prior to admission  Medication Sig Dispense Refill  . ferrous gluconate (FERGON) 325 MG tablet Take 325 mg by mouth daily with breakfast.       . Prenatal Vit-Fe Fumarate-FA (PRENATAL MULTIVITAMIN) TABS Take 1 tablet by mouth daily.          ROS--see HPI Physical Exam   Blood pressure 113/60, pulse 81, temperature 98.9 F (37.2 C), temperature source Oral, resp.  rate 18, height 5\' 5"  (1.651 m), weight 75.206 kg (165 lb 12.8 oz), SpO2 99.00%. .. Results for orders placed during the hospital encounter of 08/22/11 (from the past 24 hour(s))  URINALYSIS, ROUTINE W REFLEX MICROSCOPIC     Status: Abnormal   Collection Time   08/22/11  6:00 PM      Component Value Range   Color, Urine YELLOW  YELLOW    APPearance CLEAR  CLEAR    Specific Gravity, Urine 1.010  1.005 - 1.030    pH 6.0  5.0 - 8.0    Glucose, UA NEGATIVE  NEGATIVE (mg/dL)   Hgb urine dipstick NEGATIVE  NEGATIVE    Bilirubin Urine NEGATIVE  NEGATIVE    Ketones, ur NEGATIVE  NEGATIVE (mg/dL)   Protein, ur NEGATIVE  NEGATIVE (mg/dL)   Urobilinogen, UA 1.0  0.0 - 1.0 (mg/dL)   Nitrite NEGATIVE  NEGATIVE    Leukocytes, UA SMALL (*) NEGATIVE   URINE MICROSCOPIC-ADD ON     Status: Abnormal   Collection Time   08/22/11  6:00 PM  Component Value Range   Squamous Epithelial / LPF FEW (*) RARE    WBC, UA 3-6  <3 (WBC/hpf)  AMNISURE RUPTURE OF MEMBRANE (ROM)     Status: Normal   Collection Time   08/22/11  6:20 PM      Component Value Range   Amnisure ROM NEGATIVE    WET PREP, GENITAL     Status: Abnormal   Collection Time   08/22/11  6:20 PM      Component Value Range   Yeast, Wet Prep MODERATE (*) NONE SEEN    Trich, Wet Prep NONE SEEN  NONE SEEN    Clue Cells, Wet Prep NONE SEEN  NONE SEEN    WBC, Wet Prep HPF POC MANY (*) NONE SEEN    Physical Exam  Constitutional: She is oriented to person, place, and time. She appears well-developed and well-nourished. No distress.  Cardiovascular: Normal rate and regular rhythm.   Respiratory: Effort normal and breath sounds normal.  GI: Soft. Bowel sounds are normal.  Genitourinary:       Long/closed/-2, posterior;  When removed glove, clumpy green-tinged d/c; nonodorous.  Neurological: She is alert and oriented to person, place, and time.  EFM:  135-145, moderate variability, no decels. 10x10 accels, 2-3 15x15, but not two in 20 minutes TOCO:   Rare ctx  MAU Course  Procedures 1.  NST w/ extended monitoring for nonreactive and despite juice and lateral positions 2.  Wet prep 3.  amnisure 4.  U/s for BPP and =8/8, AFI subjectively WNL w/ largest pocket =6.1cm.  FHR=140, vtx  Assessment and Plan  1.  IUP at 37.5 2.  No s/s of labor or ROM and amnisure negative 3.  nonreactive NST, but 8/8 BPP, so 8/10 overall fetal testing 4.  Yeast vaginitis  1.  D/c home per c/w dr. Luna Glasgow repeat NST by end of this week and have office call to schedule tomorrow morning. 2.  Terazol 7 called to [facility] on [address] 3.  SROM and labor precautions rev'd and FKC 4.  F/u prn  Monike Bragdon H 08/22/2011, 9:38 PM

## 2011-08-24 ENCOUNTER — Inpatient Hospital Stay (HOSPITAL_COMMUNITY)
Admission: AD | Admit: 2011-08-24 | Discharge: 2011-08-24 | Disposition: A | Discharge status: Home / Self Care | Payer: Medicaid Other | Source: Ambulatory Visit | Attending: Obstetrics and Gynecology | Admitting: Obstetrics and Gynecology

## 2011-08-24 ENCOUNTER — Encounter (HOSPITAL_COMMUNITY): Payer: Self-pay | Admitting: *Deleted

## 2011-08-24 DIAGNOSIS — D649 Anemia, unspecified: Secondary | ICD-10-CM | POA: Insufficient documentation

## 2011-08-24 DIAGNOSIS — O99019 Anemia complicating pregnancy, unspecified trimester: Secondary | ICD-10-CM | POA: Insufficient documentation

## 2011-08-24 DIAGNOSIS — O368390 Maternal care for abnormalities of the fetal heart rate or rhythm, unspecified trimester, not applicable or unspecified: Secondary | ICD-10-CM | POA: Insufficient documentation

## 2011-08-24 LAB — CBC
MCH: 21.4 pg — ABNORMAL LOW (ref 26.0–34.0)
MCV: 69 fL — ABNORMAL LOW (ref 78.0–100.0)
Platelets: 146 10*3/uL — ABNORMAL LOW (ref 150–400)
RDW: 18.4 % — ABNORMAL HIGH (ref 11.5–15.5)

## 2011-08-24 LAB — RPR: RPR Ser Ql: NONREACTIVE

## 2011-08-24 MED ORDER — LACTATED RINGERS IV SOLN: INTRAVENOUS | Status: DC

## 2011-08-24 MED ORDER — LACTATED RINGERS IV BOLUS (SEPSIS)
500.0000 mL | Freq: Once | INTRAVENOUS | Status: AC
  Administered 2011-08-24: 500 mL via INTRAVENOUS

## 2011-08-24 NOTE — ED Provider Notes (Signed)
History   [name] is a 26 y.o. female at 28 weeks per [facility] 09/07/11, who presents for monitoring due to questionable late decels on NST at office.  Seen 1/8 in MAU for questionable leaking, with BPP 8/8, negative amnisure, and diagnosis of yeast infection.  Still reports "leaking", with occasional contractions and pelvic pressure.  Reports sometimes being unable to determine FM.   Pregnancy remarkable for: 1. Prev c/s--planned repeat 1/21 2. [place] trait  3. GBS pos  4. H/o Oligo  5. H/o thrombocytopenia  6. Asthma as a child 7. Anemia, with last Hgb 8.3 07/18/11    OB History    Grav Para Term Preterm Abortions TAB SAB Ect Mult Living   4 1  1 2  2   1       Past Medical History  Diagnosis Date  . Bacterial vaginosis   . Yeast vaginitis   . UTI (urinary tract infection)   . Asthma   . Thrombocytopenia   . Sickle cell disease   . Preterm labor   . Sickle cell trait     Past Surgical History  Procedure Date  . Cesarean section   . Eye surgery   . Eye surgery     Family History  Problem Relation Age of Onset  . Cancer Paternal Uncle   . Cancer Maternal Grandmother   . Cancer Paternal Grandfather     History  Substance Use Topics  . Smoking status: Never Smoker   . Smokeless tobacco: Never Used  . Alcohol Use: No    Allergies:  Allergies  Allergen Reactions  . Sulfa Antibiotics Hives and Itching    Prescriptions prior to admission  Medication Sig Dispense Refill  . ferrous gluconate (FERGON) 325 MG tablet Take 325 mg by mouth daily with breakfast.       . Prenatal Vit-Fe Fumarate-FA (PRENATAL MULTIVITAMIN) TABS Take 1 tablet by mouth daily.        Marland Kitchen terconazole (TERAZOL 7) 0.4 % vaginal cream Place 1 applicator vaginally at bedtime.  45 g  0   Results for orders placed during the hospital encounter of 08/24/11 (from the past 24 hour(s))  CBC     Status: Abnormal   Collection Time   08/24/11 12:31 PM      Component Value Range   WBC 10.9 (*) 4.0 - 10.5  (K/uL)   RBC 4.06  3.87 - 5.11 (MIL/uL)   Hemoglobin 8.7 (*) 12.0 - 15.0 (g/dL)   HCT 14.7 (*) 82.9 - 46.0 (%)   MCV 69.0 (*) 78.0 - 100.0 (fL)   MCH 21.4 (*) 26.0 - 34.0 (pg)   MCHC 31.1  30.0 - 36.0 (g/dL)   RDW 56.2 (*) 13.0 - 15.5 (%)   Platelets 146 (*) 150 - 400 (K/uL)  AMNISURE RUPTURE OF MEMBRANE (ROM)     Status: Normal   Collection Time   08/24/11 12:45 PM      Component Value Range   Amnisure ROM NEGATIVE       Physical Exam   Chest clear Heart RRR without murmur Abd gravid, NT Pelvic--deferred Ext WNL  FHR--on initial tracing, baby in sleep cycle, 2 contractions with ? Late decels associated.  Subsequent tracing has cycles of reactivity. Very occasional contractions.   ED Course  IUP at 38 weeks Equivocal FHR tracing No evidence ROM Anemia Previous C/S, with scheduled repeat 09/04/11,  Plan: Will consult with Dr. Stefano Gaul regarding plan of care. IV initiated, with bolus given. CBC, RPR drawn.  NPO at present.  Nigel Bridgeman, CNM, [place] 08/24/11 1:30pm  Addendum: Monitored x 2 hours, with reactivity noted Very occasional contractions, no decels Received 1 bag IV hydration. Had normal fluid assessment on 08/23/11, with normal BPP that day.  Results for orders placed during the hospital encounter of 08/24/11 (from the past 24 hour(s))  CBC     Status: Abnormal   Collection Time   08/24/11 12:31 PM      Component Value Range   WBC 10.9 (*) 4.0 - 10.5 (K/uL)   RBC 4.06  3.87 - 5.11 (MIL/uL)   Hemoglobin 8.7 (*) 12.0 - 15.0 (g/dL)   HCT 82.9 (*) 56.2 - 46.0 (%)   MCV 69.0 (*) 78.0 - 100.0 (fL)   MCH 21.4 (*) 26.0 - 34.0 (pg)   MCHC 31.1  30.0 - 36.0 (g/dL)   RDW 13.0 (*) 86.5 - 15.5 (%)   Platelets 146 (*) 150 - 400 (K/uL)  AMNISURE RUPTURE OF MEMBRANE (ROM)     Status: Normal   Collection Time   08/24/11 12:45 PM      Component Value Range   Amnisure ROM NEGATIVE      Consulted with Dr. Stefano Gaul. D/C home with [facility] instructions To follow-up as  needed or as scheduled.  Nigel Bridgeman, CNM, [place] 08/24/11 2:15p

## 2011-08-28 ENCOUNTER — Encounter (HOSPITAL_COMMUNITY)
Admission: AD | Disposition: A | Discharge status: Home / Self Care | Payer: Self-pay | Source: Ambulatory Visit | Attending: Obstetrics and Gynecology

## 2011-08-28 ENCOUNTER — Inpatient Hospital Stay (HOSPITAL_COMMUNITY)
Admission: AD | Admit: 2011-08-28 | Discharge: 2011-08-28 | Disposition: A | Discharge status: Home / Self Care | Payer: Medicaid Other | Source: Ambulatory Visit | Attending: Obstetrics and Gynecology | Admitting: Obstetrics and Gynecology

## 2011-08-28 ENCOUNTER — Encounter (HOSPITAL_COMMUNITY): Payer: Self-pay | Admitting: Anesthesiology

## 2011-08-28 ENCOUNTER — Inpatient Hospital Stay (HOSPITAL_COMMUNITY): Payer: Medicaid Other | Admitting: Anesthesiology

## 2011-08-28 ENCOUNTER — Encounter (HOSPITAL_COMMUNITY): Payer: Self-pay | Admitting: *Deleted

## 2011-08-28 ENCOUNTER — Inpatient Hospital Stay (HOSPITAL_COMMUNITY)
Admission: AD | Admit: 2011-08-28 | Discharge: 2011-08-31 | DRG: 765 | Disposition: A | Discharge status: Home / Self Care | Payer: Medicaid Other | Source: Ambulatory Visit | Attending: Obstetrics and Gynecology | Admitting: Obstetrics and Gynecology

## 2011-08-28 DIAGNOSIS — D689 Coagulation defect, unspecified: Secondary | ICD-10-CM | POA: Diagnosis not present

## 2011-08-28 DIAGNOSIS — O9902 Anemia complicating childbirth: Secondary | ICD-10-CM | POA: Diagnosis present

## 2011-08-28 DIAGNOSIS — Z98891 History of uterine scar from previous surgery: Secondary | ICD-10-CM

## 2011-08-28 DIAGNOSIS — IMO0001 Reserved for inherently not codable concepts without codable children: Secondary | ICD-10-CM

## 2011-08-28 DIAGNOSIS — D696 Thrombocytopenia, unspecified: Secondary | ICD-10-CM | POA: Diagnosis not present

## 2011-08-28 DIAGNOSIS — O471 False labor at or after 37 completed weeks of gestation: Secondary | ICD-10-CM

## 2011-08-28 DIAGNOSIS — O99892 Other specified diseases and conditions complicating childbirth: Secondary | ICD-10-CM | POA: Diagnosis present

## 2011-08-28 DIAGNOSIS — O479 False labor, unspecified: Secondary | ICD-10-CM | POA: Insufficient documentation

## 2011-08-28 DIAGNOSIS — D649 Anemia, unspecified: Secondary | ICD-10-CM | POA: Diagnosis present

## 2011-08-28 DIAGNOSIS — O9913 Other diseases of the blood and blood-forming organs and certain disorders involving the immune mechanism complicating the puerperium: Secondary | ICD-10-CM | POA: Diagnosis not present

## 2011-08-28 DIAGNOSIS — O34219 Maternal care for unspecified type scar from previous cesarean delivery: Secondary | ICD-10-CM | POA: Insufficient documentation

## 2011-08-28 DIAGNOSIS — O99820 Streptococcus B carrier state complicating pregnancy: Secondary | ICD-10-CM

## 2011-08-28 DIAGNOSIS — Z22330 Carrier of Group B streptococcus: Secondary | ICD-10-CM

## 2011-08-28 HISTORY — DX: Papillomavirus as the cause of diseases classified elsewhere: B97.7

## 2011-08-28 HISTORY — DX: Unspecified abnormal cytological findings in specimens from cervix uteri: R87.619

## 2011-08-28 HISTORY — DX: Chlamydial infection, unspecified: A74.9

## 2011-08-28 HISTORY — DX: Reserved for concepts with insufficient information to code with codable children: IMO0002

## 2011-08-28 LAB — CBC
MCH: 21.4 pg — ABNORMAL LOW (ref 26.0–34.0)
MCHC: 31 g/dL (ref 30.0–36.0)
MCV: 68.9 fL — ABNORMAL LOW (ref 78.0–100.0)
Platelets: 168 10*3/uL (ref 150–400)
RDW: 18.7 % — ABNORMAL HIGH (ref 11.5–15.5)

## 2011-08-28 SURGERY — Surgical Case
Anesthesia: Spinal | Site: Abdomen | Wound class: Clean Contaminated

## 2011-08-28 MED ORDER — DIPHENHYDRAMINE HCL 25 MG PO CAPS
25.0000 mg | ORAL_CAPSULE | ORAL | Status: DC | PRN
  Administered 2011-08-29: 25 mg via ORAL
  Filled 2011-08-28: qty 1

## 2011-08-28 MED ORDER — DIBUCAINE 1 % RE OINT: 1.0000 "application " | TOPICAL_OINTMENT | RECTAL | Status: DC | PRN

## 2011-08-28 MED ORDER — KETOROLAC TROMETHAMINE 30 MG/ML IJ SOLN: 30.0000 mg | Freq: Four times a day (QID) | INTRAMUSCULAR | Status: AC | PRN

## 2011-08-28 MED ORDER — PRENATAL MULTIVITAMIN CH
1.0000 | ORAL_TABLET | Freq: Every day | ORAL | Status: DC
  Administered 2011-08-29 – 2011-08-31 (×3): 1 via ORAL
  Filled 2011-08-28 (×3): qty 1

## 2011-08-28 MED ORDER — LACTATED RINGERS IV SOLN
INTRAVENOUS | Status: DC
  Administered 2011-08-28 (×3): via INTRAVENOUS

## 2011-08-28 MED ORDER — CEFAZOLIN SODIUM 1-5 GM-% IV SOLN: 1.0000 g | Freq: Once | INTRAVENOUS | Status: DC

## 2011-08-28 MED ORDER — OXYTOCIN 10 UNIT/ML IJ SOLN
INTRAMUSCULAR | Status: AC
  Filled 2011-08-28: qty 2

## 2011-08-28 MED ORDER — WITCH HAZEL-GLYCERIN EX PADS: 1.0000 "application " | MEDICATED_PAD | CUTANEOUS | Status: DC | PRN

## 2011-08-28 MED ORDER — OXYTOCIN 20 UNITS IN LACTATED RINGERS INFUSION - SIMPLE: 125.0000 mL/h | INTRAVENOUS | Status: AC

## 2011-08-28 MED ORDER — MORPHINE SULFATE 0.5 MG/ML IJ SOLN
INTRAMUSCULAR | Status: AC
  Filled 2011-08-28: qty 10

## 2011-08-28 MED ORDER — BUPIVACAINE HCL 0.75 % IJ SOLN
INTRAMUSCULAR | Status: DC | PRN
  Administered 2011-08-28: 1.6 mL

## 2011-08-28 MED ORDER — SCOPOLAMINE 1 MG/3DAYS TD PT72
MEDICATED_PATCH | TRANSDERMAL | Status: AC
  Filled 2011-08-28: qty 1

## 2011-08-28 MED ORDER — ZOLPIDEM TARTRATE 5 MG PO TABS: 5.0000 mg | ORAL_TABLET | Freq: Every evening | ORAL | Status: DC | PRN

## 2011-08-28 MED ORDER — CEFAZOLIN SODIUM 1-5 GM-% IV SOLN
INTRAVENOUS | Status: DC | PRN
  Administered 2011-08-28: 1 g via INTRAVENOUS

## 2011-08-28 MED ORDER — FENTANYL CITRATE 0.05 MG/ML IJ SOLN
INTRAMUSCULAR | Status: AC
  Filled 2011-08-28: qty 2

## 2011-08-28 MED ORDER — FERROUS GLUCONATE 324 (38 FE) MG PO TABS: 325.0000 mg | ORAL_TABLET | Freq: Every day | ORAL | Status: DC

## 2011-08-28 MED ORDER — SENNOSIDES-DOCUSATE SODIUM 8.6-50 MG PO TABS
2.0000 | ORAL_TABLET | Freq: Every day | ORAL | Status: DC
  Administered 2011-08-29 – 2011-08-30 (×2): 2 via ORAL

## 2011-08-28 MED ORDER — ONDANSETRON HCL 4 MG/2ML IJ SOLN
INTRAMUSCULAR | Status: DC | PRN
  Administered 2011-08-28: 4 mg via INTRAVENOUS

## 2011-08-28 MED ORDER — ONDANSETRON HCL 4 MG PO TABS
4.0000 mg | ORAL_TABLET | ORAL | Status: DC | PRN
  Administered 2011-08-30: 4 mg via ORAL
  Filled 2011-08-28: qty 1

## 2011-08-28 MED ORDER — DIPHENHYDRAMINE HCL 50 MG/ML IJ SOLN
INTRAMUSCULAR | Status: DC | PRN
  Administered 2011-08-28: 25 mg via INTRAVENOUS

## 2011-08-28 MED ORDER — DIPHENHYDRAMINE HCL 25 MG PO CAPS: 25.0000 mg | ORAL_CAPSULE | Freq: Four times a day (QID) | ORAL | Status: DC | PRN

## 2011-08-28 MED ORDER — METHYLERGONOVINE MALEATE 0.2 MG PO TABS: 0.2000 mg | ORAL_TABLET | ORAL | Status: DC | PRN

## 2011-08-28 MED ORDER — MORPHINE SULFATE (PF) 0.5 MG/ML IJ SOLN
INTRAMUSCULAR | Status: DC | PRN
  Administered 2011-08-28: 100 ug via INTRATHECAL

## 2011-08-28 MED ORDER — SIMETHICONE 80 MG PO CHEW
80.0000 mg | CHEWABLE_TABLET | Freq: Three times a day (TID) | ORAL | Status: DC
  Administered 2011-08-29 – 2011-08-31 (×9): 80 mg via ORAL

## 2011-08-28 MED ORDER — EPHEDRINE 5 MG/ML INJ
INTRAVENOUS | Status: AC
  Filled 2011-08-28: qty 10

## 2011-08-28 MED ORDER — SCOPOLAMINE 1 MG/3DAYS TD PT72
1.0000 | MEDICATED_PATCH | Freq: Once | TRANSDERMAL | Status: DC
  Administered 2011-08-28: 1.5 mg via TRANSDERMAL

## 2011-08-28 MED ORDER — ONDANSETRON HCL 4 MG/2ML IJ SOLN: 4.0000 mg | INTRAMUSCULAR | Status: DC | PRN

## 2011-08-28 MED ORDER — OXYTOCIN 20 UNITS IN LACTATED RINGERS INFUSION - SIMPLE
INTRAVENOUS | Status: DC | PRN
  Administered 2011-08-28 (×2): 20 [IU] via INTRAVENOUS

## 2011-08-28 MED ORDER — LANOLIN HYDROUS EX OINT: 1.0000 "application " | TOPICAL_OINTMENT | CUTANEOUS | Status: DC | PRN

## 2011-08-28 MED ORDER — METOCLOPRAMIDE HCL 5 MG/ML IJ SOLN: 10.0000 mg | Freq: Three times a day (TID) | INTRAMUSCULAR | Status: DC | PRN

## 2011-08-28 MED ORDER — NALOXONE HCL 0.4 MG/ML IJ SOLN
1.0000 ug/kg/h | INTRAMUSCULAR | Status: DC | PRN
  Filled 2011-08-28: qty 2.5

## 2011-08-28 MED ORDER — NALBUPHINE HCL 10 MG/ML IJ SOLN
5.0000 mg | INTRAMUSCULAR | Status: DC | PRN
  Filled 2011-08-28: qty 1

## 2011-08-28 MED ORDER — IBUPROFEN 600 MG PO TABS
600.0000 mg | ORAL_TABLET | Freq: Four times a day (QID) | ORAL | Status: DC
  Administered 2011-08-29 – 2011-08-31 (×10): 600 mg via ORAL
  Filled 2011-08-28 (×3): qty 1

## 2011-08-28 MED ORDER — FERROUS SULFATE 325 (65 FE) MG PO TABS
325.0000 mg | ORAL_TABLET | Freq: Two times a day (BID) | ORAL | Status: DC
  Administered 2011-08-29 – 2011-08-31 (×4): 325 mg via ORAL
  Filled 2011-08-28 (×4): qty 1

## 2011-08-28 MED ORDER — SODIUM CHLORIDE 0.9 % IJ SOLN: 3.0000 mL | INTRAMUSCULAR | Status: DC | PRN

## 2011-08-28 MED ORDER — PRENATAL MULTIVITAMIN CH: 1.0000 | ORAL_TABLET | Freq: Every day | ORAL | Status: DC

## 2011-08-28 MED ORDER — METHYLERGONOVINE MALEATE 0.2 MG/ML IJ SOLN: 0.2000 mg | INTRAMUSCULAR | Status: DC | PRN

## 2011-08-28 MED ORDER — FAMOTIDINE IN NACL 20-0.9 MG/50ML-% IV SOLN
20.0000 mg | Freq: Once | INTRAVENOUS | Status: AC
  Administered 2011-08-28: 20 mg via INTRAVENOUS
  Filled 2011-08-28: qty 50

## 2011-08-28 MED ORDER — SIMETHICONE 80 MG PO CHEW: 80.0000 mg | CHEWABLE_TABLET | ORAL | Status: DC | PRN

## 2011-08-28 MED ORDER — DIPHENHYDRAMINE HCL 50 MG/ML IJ SOLN
INTRAMUSCULAR | Status: AC
  Filled 2011-08-28: qty 1

## 2011-08-28 MED ORDER — OXYCODONE-ACETAMINOPHEN 5-325 MG PO TABS
1.0000 | ORAL_TABLET | ORAL | Status: DC | PRN
  Administered 2011-08-29 – 2011-08-31 (×6): 1 via ORAL
  Filled 2011-08-28 (×6): qty 1

## 2011-08-28 MED ORDER — CITRIC ACID-SODIUM CITRATE 334-500 MG/5ML PO SOLN: 30.0000 mL | Freq: Once | ORAL | Status: DC

## 2011-08-28 MED ORDER — ONDANSETRON HCL 4 MG/2ML IJ SOLN
INTRAMUSCULAR | Status: AC
  Filled 2011-08-28: qty 2

## 2011-08-28 MED ORDER — CITRIC ACID-SODIUM CITRATE 334-500 MG/5ML PO SOLN
30.0000 mL | Freq: Once | ORAL | Status: AC
  Administered 2011-08-28: 30 mL via ORAL
  Filled 2011-08-28: qty 15

## 2011-08-28 MED ORDER — EPHEDRINE SULFATE 50 MG/ML IJ SOLN
INTRAMUSCULAR | Status: DC | PRN
  Administered 2011-08-28 (×3): 5 mg via INTRAVENOUS

## 2011-08-28 MED ORDER — MEPERIDINE HCL 25 MG/ML IJ SOLN: 6.2500 mg | INTRAMUSCULAR | Status: DC | PRN

## 2011-08-28 MED ORDER — KETOROLAC TROMETHAMINE 30 MG/ML IJ SOLN
30.0000 mg | Freq: Four times a day (QID) | INTRAMUSCULAR | Status: AC | PRN
  Administered 2011-08-28: 30 mg via INTRAVENOUS

## 2011-08-28 MED ORDER — MEASLES, MUMPS & RUBELLA VAC ~~LOC~~ INJ
0.5000 mL | INJECTION | Freq: Once | SUBCUTANEOUS | Status: DC
  Filled 2011-08-28: qty 0.5

## 2011-08-28 MED ORDER — MENTHOL 3 MG MT LOZG
1.0000 | LOZENGE | OROMUCOSAL | Status: DC | PRN
  Filled 2011-08-28: qty 9

## 2011-08-28 MED ORDER — TETANUS-DIPHTH-ACELL PERTUSSIS 5-2.5-18.5 LF-MCG/0.5 IM SUSP
0.5000 mL | Freq: Once | INTRAMUSCULAR | Status: AC
  Administered 2011-08-29: 0.5 mL via INTRAMUSCULAR
  Filled 2011-08-28: qty 0.5

## 2011-08-28 MED ORDER — PHENYLEPHRINE 40 MCG/ML (10ML) SYRINGE FOR IV PUSH (FOR BLOOD PRESSURE SUPPORT)
PREFILLED_SYRINGE | INTRAVENOUS | Status: AC
  Filled 2011-08-28: qty 5

## 2011-08-28 MED ORDER — FENTANYL CITRATE 0.05 MG/ML IJ SOLN
25.0000 ug | INTRAMUSCULAR | Status: DC | PRN
  Administered 2011-08-28 (×2): 50 ug via INTRAVENOUS

## 2011-08-28 MED ORDER — CEFAZOLIN SODIUM 1-5 GM-% IV SOLN
INTRAVENOUS | Status: AC
  Filled 2011-08-28: qty 50

## 2011-08-28 MED ORDER — ONDANSETRON HCL 4 MG/2ML IJ SOLN: 4.0000 mg | Freq: Three times a day (TID) | INTRAMUSCULAR | Status: DC | PRN

## 2011-08-28 MED ORDER — DIPHENHYDRAMINE HCL 50 MG/ML IJ SOLN: 25.0000 mg | INTRAMUSCULAR | Status: DC | PRN

## 2011-08-28 MED ORDER — IBUPROFEN 600 MG PO TABS
600.0000 mg | ORAL_TABLET | Freq: Four times a day (QID) | ORAL | Status: DC | PRN
  Filled 2011-08-28 (×5): qty 1

## 2011-08-28 MED ORDER — DIPHENHYDRAMINE HCL 50 MG/ML IJ SOLN: 12.5000 mg | INTRAMUSCULAR | Status: DC | PRN

## 2011-08-28 MED ORDER — NALBUPHINE HCL 10 MG/ML IJ SOLN
5.0000 mg | INTRAMUSCULAR | Status: DC | PRN
  Administered 2011-08-29 (×2): 5 mg via INTRAVENOUS
  Filled 2011-08-28 (×3): qty 1

## 2011-08-28 MED ORDER — KETOROLAC TROMETHAMINE 30 MG/ML IJ SOLN
INTRAMUSCULAR | Status: AC
  Filled 2011-08-28: qty 1

## 2011-08-28 MED ORDER — FENTANYL CITRATE 0.05 MG/ML IJ SOLN
INTRAMUSCULAR | Status: DC | PRN
  Administered 2011-08-28: 75 ug via INTRAVENOUS

## 2011-08-28 MED ORDER — FENTANYL CITRATE 0.05 MG/ML IJ SOLN
INTRAMUSCULAR | Status: DC | PRN
  Administered 2011-08-28: 25 ug via INTRATHECAL

## 2011-08-28 MED ORDER — NALOXONE HCL 0.4 MG/ML IJ SOLN: 0.4000 mg | INTRAMUSCULAR | Status: DC | PRN

## 2011-08-28 MED ORDER — BUPIVACAINE HCL (PF) 0.25 % IJ SOLN
INTRAMUSCULAR | Status: DC | PRN
  Administered 2011-08-28: 20 mL

## 2011-08-28 SURGICAL SUPPLY — 42 items
APL SKNCLS STERI-STRIP NONHPOA (GAUZE/BANDAGES/DRESSINGS) ×1
BENZOIN TINCTURE PRP APPL 2/3 (GAUZE/BANDAGES/DRESSINGS) ×2 IMPLANT
BOOTIES KNEE HIGH SLOAN (MISCELLANEOUS) ×4 IMPLANT
CHLORAPREP W/TINT 26ML (MISCELLANEOUS) ×2 IMPLANT
CLOSURE STERI STRIP 1/2 X4 (GAUZE/BANDAGES/DRESSINGS) ×1 IMPLANT
CLOTH BEACON ORANGE TIMEOUT ST (SAFETY) ×2 IMPLANT
DRAIN JACKSON PRT FLT 10 (DRAIN) IMPLANT
DRESSING TELFA 8X3 (GAUZE/BANDAGES/DRESSINGS) ×3 IMPLANT
DRSG PAD ABDOMINAL 8X10 ST (GAUZE/BANDAGES/DRESSINGS) ×1 IMPLANT
ELECT REM PT RETURN 9FT ADLT (ELECTROSURGICAL) ×2
ELECTRODE REM PT RTRN 9FT ADLT (ELECTROSURGICAL) ×1 IMPLANT
EVACUATOR SILICONE 100CC (DRAIN) IMPLANT
EXTRACTOR VACUUM M CUP 4 TUBE (SUCTIONS) IMPLANT
GAUZE SPONGE 4X4 12PLY STRL LF (GAUZE/BANDAGES/DRESSINGS) ×4 IMPLANT
GLOVE BIOGEL PI IND STRL 7.0 (GLOVE) ×2 IMPLANT
GLOVE BIOGEL PI INDICATOR 7.0 (GLOVE) ×2
GLOVE ECLIPSE 6.5 STRL STRAW (GLOVE) ×2 IMPLANT
GOWN PREVENTION PLUS LG XLONG (DISPOSABLE) ×5 IMPLANT
KIT ABG SYR 3ML LUER SLIP (SYRINGE) IMPLANT
NDL HYPO 25X5/8 SAFETYGLIDE (NEEDLE) IMPLANT
NEEDLE HYPO 22GX1.5 SAFETY (NEEDLE) ×2 IMPLANT
NEEDLE HYPO 25X5/8 SAFETYGLIDE (NEEDLE) IMPLANT
NS IRRIG 1000ML POUR BTL (IV SOLUTION) ×4 IMPLANT
PACK C SECTION WH (CUSTOM PROCEDURE TRAY) ×2 IMPLANT
PAD ABD 7.5X8 STRL (GAUZE/BANDAGES/DRESSINGS) ×2 IMPLANT
RTRCTR C-SECT PINK 25CM LRG (MISCELLANEOUS) ×1 IMPLANT
SLEEVE SCD COMPRESS KNEE MED (MISCELLANEOUS) ×1 IMPLANT
SPONGE GAUZE 4X4 12PLY (GAUZE/BANDAGES/DRESSINGS) ×1 IMPLANT
STRIP CLOSURE SKIN 1/2X4 (GAUZE/BANDAGES/DRESSINGS) ×2 IMPLANT
SUT CHROMIC GUT AB #0 18 (SUTURE) IMPLANT
SUT MNCRL AB 3-0 PS2 27 (SUTURE) ×2 IMPLANT
SUT SILK 2 0 FSL 18 (SUTURE) ×1 IMPLANT
SUT VIC AB 0 CT1 27 (SUTURE) ×2
SUT VIC AB 0 CT1 27XBRD ANBCTR (SUTURE) IMPLANT
SUT VIC AB 0 CTX 36 (SUTURE) ×10
SUT VIC AB 0 CTX36XBRD ANBCTRL (SUTURE) ×2 IMPLANT
SUT VIC AB 1 CT1 36 (SUTURE) ×4 IMPLANT
SYR 20CC LL (SYRINGE) ×2 IMPLANT
TAPE CLOTH SURG 4X10 WHT LF (GAUZE/BANDAGES/DRESSINGS) ×1 IMPLANT
TOWEL OR 17X24 6PK STRL BLUE (TOWEL DISPOSABLE) ×4 IMPLANT
TRAY FOLEY CATH 14FR (SET/KITS/TRAYS/PACK) ×2 IMPLANT
WATER STERILE IRR 1000ML POUR (IV SOLUTION) ×1 IMPLANT

## 2011-08-28 NOTE — Transfer of Care (Signed)
Immediate Anesthesia Transfer of Care Note  Patient: [name]  Procedure(s) Performed:  CESAREAN SECTION  Patient Location: PACU  Anesthesia Type: Spinal  Level of Consciousness: awake, alert  and oriented  Airway & Oxygen Therapy: Patient Spontanous Breathing  Post-op Assessment: Report given to PACU RN and Post -op Vital signs reviewed and stable  Post vital signs: Reviewed and stable Filed Vitals:   08/28/11 1924  BP: 129/67  Pulse: 87  Temp: 36.7 C  Resp: 20    Complications: No apparent anesthesia complications

## 2011-08-28 NOTE — Anesthesia Preprocedure Evaluation (Signed)
Anesthesia Evaluation  Patient identified by MRN, date of birth, ID band Patient awake    Reviewed: Allergy & Precautions, H&P , NPO status , Patient's Chart, lab work & pertinent test results  Airway Mallampati: III TM Distance: >3 FB Neck ROM: full    Dental No notable dental hx. (+) Teeth Intact   Pulmonary asthma ,  clear to auscultation  Pulmonary exam normal       Cardiovascular neg cardio ROS regular Tachycardia    Neuro/Psych PSYCHIATRIC DISORDERS Anxiety Negative Neurological ROS     GI/Hepatic negative GI ROS, Neg liver ROS,   Endo/Other  Negative Endocrine ROS  Renal/GU negative Renal ROS  Genitourinary negative   Musculoskeletal negative musculoskeletal ROS (+)   Abdominal Normal abdominal exam  (+)   Peds  Hematology  (+) Sickle cell anemia ,   Anesthesia Other Findings   Reproductive/Obstetrics (+) Pregnancy                           Anesthesia Physical Anesthesia Plan  ASA: II  Anesthesia Plan: Spinal   Post-op Pain Management:    Induction:   Airway Management Planned: Natural Airway  Additional Equipment:   Intra-op Plan:   Post-operative Plan:   Informed Consent: I have reviewed the patients History and Physical, chart, labs and discussed the procedure including the risks, benefits and alternatives for the proposed anesthesia with the patient or authorized representative who has indicated his/her understanding and acceptance.     Plan Discussed with: CRNA, Anesthesiologist and Surgeon  Anesthesia Plan Comments:         Anesthesia Quick Evaluation

## 2011-08-28 NOTE — Brief Op Note (Signed)
08/28/2011  8:03 PM  PATIENT:  [name]  26 y.o. female  PRE-OPERATIVE DIAGNOSIS:  Previous Cesarean Section in Labor  POST-OPERATIVE DIAGNOSIS:  Previous Cesarean Section in Labor    Procedure(s): CESAREAN SECTION    Surgeon(s): Esmeralda Arthur, MD   ASSISTANTS: none   ANESTHESIA:   local and regional  LOCAL MEDICATIONS USED:  MARCAINE 20 CC  SPECIMEN:  placenta  DISPOSITION OF SPECIMEN:  L&D  COUNTS:  YES  ESTIMATED BLOOD LOSS: 600ccf  PATIENT DISPOSITION:  PACU - hemodynamically stable.    [facility] 08/28/2011 8:03 PM

## 2011-08-28 NOTE — Anesthesia Procedure Notes (Signed)
Spinal  Patient location during procedure: OR Start time: 08/28/2011 8:06 PM Staffing Anesthesiologist: Brayton Caves R Performed by: anesthesiologist  Preanesthetic Checklist Completed: patient identified, site marked, surgical consent, pre-op evaluation, timeout performed, IV checked, risks and benefits discussed and monitors and equipment checked Spinal Block Patient position: sitting Prep: DuraPrep Patient monitoring: heart rate, cardiac monitor, continuous pulse ox and blood pressure Approach: midline Location: L3-4 Injection technique: single-shot Needle Needle type: Sprotte  Needle gauge: 24 G Needle length: 9 cm Assessment Sensory level: T4 Additional Notes Patient identified.  Risk benefits discussed including failed block, incomplete pain control, headache, nerve damage, paralysis, blood pressure changes, nausea, vomiting, reactions to medication both toxic or allergic, and postpartum back pain.  Patient expressed understanding and wished to proceed.  All questions were answered.  Sterile technique used throughout procedure.  CSF was clear.  No parasthesia or other complications.  Please see nursing notes for vital signs.

## 2011-08-28 NOTE — Op Note (Signed)
Preoperative diagnosis: Intrauterine pregnancy at 38 weeks and 4 days in [place] labor desiring a repeat cesarean delivery   Post operative diagnosis: Same  Anesthesia: Spinal  Anesthesiologist: Dr. Malen Gauze  Procedure: Repeat low transverse cesarean section  Surgeon: Dr. Dois Davenport Tandi Hanko  Assistant: none  Estimated blood loss: 600 cc  Procedure:  After being informed of the planned procedure and possible complications including bleeding, infection, injury to other organs, informed consent is obtained. The patient is taken to OR #1 and given spinal anesthesia without complication. She is placed in the dorsal decubitus position with the pelvis tilted to the left. She is then prepped and draped in a sterile fashion. A Foley catheter is inserted in her bladder.  After assessing adequate level of anesthesia, we infiltrate the suprapubic area with 20 cc of Marcaine 0.25 and perform a Pfannenstiel incision which is brought down sharply to the fascia. The fascia is entered in a low transverse fashion. Linea alba is dissected with difficulty. During this dissection, a small breach in the peritoneum reveals adhesions with the omentum. We proceed with caution. The peritoneum is then entered bluntly giving [place] a visual inspection of the adhesions with the omentum. The omentum is grab with 2 Kelly forceps, sectioned and sutured with a free tie of 0 Vicryl. At this point, we note a dense adhesion between the body of the uterus and the anterior abdominal wall measuring 2 cm in thickness and 6 cm in width. Going around this adhesion and we note that that does not involve the bladder. Using cauterization, we progressively cut this adhesion from the abdominal wall. In the process the visceral peritoneum is entered. We can now see the vesicouterine recess away from our site of dissection. An Alexis retractor is easily positioned. Visceral peritoneum is entered in a low transverse fashion allowing [place] to safely retract  bladder by developing a bladder flap.  The myometrium is then entered in a low transverse fashion; first with knife and then extended bluntly. Amniotic fluid is clear. We assist the birth of a female  infant in vertex presentation. Mouth and nose are suctioned. The baby is delivered. The cord is clamped and sectioned. The baby is given to the neonatologist present in the room.  10 cc of blood is drawn from the umbilical vein.The placenta is allowed to deliver spontaneously. It is complete and the cord has 3 vessels. Uterine revision is negative.  We proceed with closure of the myometrium in 2 layers: First with a running locked suture of 0 Vicryl, then with a Lembert suture of 0 Vicryl imbricating the first one. Hemostasis is completed with cauterization on peritoneal edges. We also have to repair the defect in the myometrium left by the lysis of the thick adhesion. It is repaired in 3 layers of figure-of-eight stitches of 0 Vicryl. Hemostasis is adequate.  Both paracolic gutters are cleaned. Both tubes and ovaries are assessed and normal. The pelvis is profusely irrigated with warm saline to confirm a satisfactory hemostasis.  Retractors and sponges are removed. A full sheet of Interceed is placed to cover both the previously repaired defect and the low transverse incision. Under fascia hemostasis is completed with cauterization. The fascia is then closed with 2 running sutures of 0 Vicryl meeting midline. The wound is irrigated with warm saline and hemostasis is completed with cauterization. The skin is closed with a subcuticular suture of 3-0 Monocryl and Steri-Strips.  Instrument and sponge count is complete x2. Estimated blood loss is 600 cc.  The procedure is well tolerated by the patient who is taken to recovery room in a well and stable condition.  female baby named [name] was born at 8:42 PM and received an Apgar of 9  at 1 minute and 9 at 5 minutes. Weight was 7 lbs  1 oz.     Specimen: Placenta sent to L & D   [name] A MD 1/14/20138:04 PM
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
## 2011-08-28 NOTE — Anesthesia Preprocedure Evaluation (Addendum)
Anesthesia Evaluation  Patient identified by MRN, date of birth, ID band Patient awake    Reviewed: Allergy & Precautions, H&P , NPO status , Patient's Chart, lab work & pertinent test results  Airway Mallampati: III TM Distance: >3 FB Neck ROM: full    Dental No notable dental hx. (+) Teeth Intact   Pulmonary asthma ,  clear to auscultation  Pulmonary exam normal       Cardiovascular neg cardio ROS regular Tachycardia+ Systolic murmurs    Neuro/Psych PSYCHIATRIC DISORDERS Anxiety Negative Neurological ROS     GI/Hepatic negative GI ROS, Neg liver ROS,   Endo/Other  Negative Endocrine ROS  Renal/GU negative Renal ROS  Genitourinary negative   Musculoskeletal negative musculoskeletal ROS (+)   Abdominal Normal abdominal exam  (+)   Peds  Hematology  (+) Blood dyscrasia, Sickle cell anemia , Hx/o Thrombocytopenia   Anesthesia Other Findings   Reproductive/Obstetrics (+) Pregnancy                          Anesthesia Physical  Anesthesia Plan  ASA: II and Emergent  Anesthesia Plan: Spinal   Post-op Pain Management:    Induction:   Airway Management Planned: Natural Airway  Additional Equipment:   Intra-op Plan:   Post-operative Plan:   Informed Consent: I have reviewed the patients History and Physical, chart, labs and discussed the procedure including the risks, benefits and alternatives for the proposed anesthesia with the patient or authorized representative who has indicated his/her understanding and acceptance.     Plan Discussed with: CRNA, Anesthesiologist and Surgeon  Anesthesia Plan Comments:        Anesthesia Quick Evaluation

## 2011-08-28 NOTE — Anesthesia Postprocedure Evaluation (Signed)
Anesthesia Post Note  Patient: [name]  Procedure(s) Performed:  CESAREAN SECTION  Anesthesia type: Spinal  Patient location: PACU  Post pain: Pain level controlled  Post assessment: Post-op Vital signs reviewed  Last Vitals:  Filed Vitals:   08/28/11 2200  BP: 128/71  Pulse: 76  Temp:   Resp: 18    Post vital signs: Reviewed  Level of consciousness: awake  Complications: No apparent anesthesia complications

## 2011-08-28 NOTE — Progress Notes (Signed)
Sent from office, has been contracting all morning.  Was 2 cm, to be monitored and rechecked.  Denies bleeding or leaking.  Is for repeat c/s.
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
## 2011-08-29 ENCOUNTER — Other Ambulatory Visit (HOSPITAL_COMMUNITY): Payer: Medicaid Other

## 2011-08-29 LAB — RPR: RPR Ser Ql: NONREACTIVE

## 2011-08-29 LAB — CBC
Hemoglobin: 7.6 g/dL — ABNORMAL LOW (ref 12.0–15.0)
MCH: 21.2 pg — ABNORMAL LOW (ref 26.0–34.0)
MCV: 68.7 fL — ABNORMAL LOW (ref 78.0–100.0)
Platelets: 140 10*3/uL — ABNORMAL LOW (ref 150–400)
RBC: 3.58 MIL/uL — ABNORMAL LOW (ref 3.87–5.11)

## 2011-08-29 MED ORDER — POLYSACCHARIDE IRON 150 MG PO CAPS
150.0000 mg | ORAL_CAPSULE | Freq: Two times a day (BID) | ORAL | Status: DC
  Administered 2011-08-29 – 2011-08-30 (×4): 150 mg via ORAL
  Filled 2011-08-29 (×7): qty 1

## 2011-08-29 MED ORDER — MENTHOL 3 MG MT LOZG: 1.0000 | LOZENGE | OROMUCOSAL | Status: DC | PRN

## 2011-08-29 NOTE — Progress Notes (Signed)
Progress note  Subjective:  The patient denies dizziness. She reports that she is able to ambulate without difficulty.  Objective:  Blood pressure: 114/68 pulse 81 lying  Blood pressure: 115/73 pulse 85 sitting  Blood pressure: 119/76 pulse 108 standing  CBC    Component Value Date/Time   WBC 13.3* 08/29/2011 0528   WBC 8.8 07/13/2011 1417   RBC 3.58* 08/29/2011 0528   RBC 3.62* 07/13/2011 1417   HGB 7.6* 08/29/2011 0528   HGB 8.3* 07/13/2011 1417   HCT 24.6* 08/29/2011 0528   HCT 25.7* 07/13/2011 1417   PLT 140* 08/29/2011 0528   PLT 169 07/13/2011 1417   MCV 68.7* 08/29/2011 0528   MCV 71.0* 07/13/2011 1417   MCH 21.2* 08/29/2011 0528   MCH 22.9* 07/13/2011 1417   MCHC 30.9 08/29/2011 0528   MCHC 32.3 07/13/2011 1417   RDW 18.7* 08/29/2011 0528   RDW 16.9* 07/13/2011 1417   LYMPHSABS 1.3 07/13/2011 1417   LYMPHSABS 1.2 05/29/2011 1230   MONOABS 0.4 07/13/2011 1417   MONOABS 0.4 05/29/2011 1230   EOSABS 0.2 07/13/2011 1417   EOSABS 0.3 05/29/2011 1230   BASOSABS 0.0 07/13/2011 1417   BASOSABS 0.0 05/29/2011 1230    Assessment:  Chronic anemia  Not symptomatic  Plan:  The management options for severe anemia were discussed. We discussed a blood transfusion and outpatient management including iron therapy. The risk and benefits of these options were outlined. Questions were answered. The patient elected to followup with outpatient management (the patient is being followed by hematologist) and she declines a blood transfusion at this time.  Mylinda Latina.D.

## 2011-08-29 NOTE — Anesthesia Postprocedure Evaluation (Signed)
  Anesthesia Post-op Note  Patient: [name]  Procedure(s) Performed:  CESAREAN SECTION - Repeat  Patient Location: Mother/Baby  Anesthesia Type: Spinal  Level of Consciousness: awake, alert  and oriented  Airway and Oxygen Therapy: Patient Spontanous Breathing  Post-op Pain: none  Post-op Assessment: Post-op Vital signs reviewed, Patient's Cardiovascular Status Stable, Respiratory Function Stable and No signs of Nausea or vomiting  Post-op Vital Signs: Reviewed and stable  Complications: No apparent anesthesia complications

## 2011-08-29 NOTE — Progress Notes (Signed)
PROGRESS NOTE  I have reviewed the patient's vital signs, labs, and notes. I have examined the patient. I agree with the previous note from the Certified Nurse Midwife.  Kinda Pottle Vernon Tauren Delbuono, M.D.  

## 2011-08-29 NOTE — Progress Notes (Signed)
UR chart review completed.  

## 2011-08-29 NOTE — Progress Notes (Signed)
Subjective: Postpartum Day 1: Cesarean Delivery due to repeat in labor Patient reports dry throat and eyes since surgery.  Denies URI symptoms.  Had N/V last night just after surgery, but has kept water down through night.  Denies nausea now.  Ambulated several steps during night, no syncope or dizziness.  Breast feeding.  No significant pain.    Objective: Vital signs in last 24 hours: Temp:  [97.5 F (36.4 C)-99.4 F (37.4 C)] 97.5 F (36.4 C) (01/15 0600) Pulse Rate:  [66-93] 69  (01/15 0600) Resp:  [15-20] 18  (01/15 0600) BP: (107-142)/(61-87) 107/68 mmHg (01/15 0600) SpO2:  [96 %-100 %] 96 % (01/15 0600) Weight:  [170 lb (77.111 kg)] 170 lb (77.111 kg) (01/15 0400)  Results for orders placed during the hospital encounter of 08/28/11 (from the past 24 hour(s))  CBC     Status: Abnormal   Collection Time   08/28/11  7:30 PM      Component Value Range   WBC 11.7 (*) 4.0 - 10.5 (K/uL)   RBC 4.02  3.87 - 5.11 (MIL/uL)   Hemoglobin 8.6 (*) 12.0 - 15.0 (g/dL)   HCT 21.3 (*) 08.6 - 46.0 (%)   MCV 68.9 (*) 78.0 - 100.0 (fL)   MCH 21.4 (*) 26.0 - 34.0 (pg)   MCHC 31.0  30.0 - 36.0 (g/dL)   RDW 57.8 (*) 46.9 - 15.5 (%)   Platelets 168  150 - 400 (K/uL)  RPR     Status: Normal   Collection Time   08/28/11  7:30 PM      Component Value Range   RPR NON REACTIVE  NON REACTIVE   CBC     Status: Abnormal   Collection Time   08/29/11  5:28 AM      Component Value Range   WBC 13.3 (*) 4.0 - 10.5 (K/uL)   RBC 3.58 (*) 3.87 - 5.11 (MIL/uL)   Hemoglobin 7.6 (*) 12.0 - 15.0 (g/dL)   HCT 62.9 (*) 52.8 - 46.0 (%)   MCV 68.7 (*) 78.0 - 100.0 (fL)   MCH 21.2 (*) 26.0 - 34.0 (pg)   MCHC 30.9  30.0 - 36.0 (g/dL)   RDW 41.3 (*) 24.4 - 15.5 (%)   Platelets 140 (*) 150 - 400 (K/uL)    Physical Exam:  General: alert, in good spirits Chest clear Heart RRR without murmur Lochia: appropriate Uterine Fundus: firm Incision: Dressing CDI DVT Evaluation: No evidence of DVT seen on physical  exam. Negative Homan's sign.  SCDs on Foley draining clear urine Positive bowel sounds in all quadrants  Basename 08/29/11 0528 08/28/11 1930  HGB 7.6* 8.6*  HCT 24.6* 27.7*    Assessment/Plan: Status post Cesarean section. Doing well postoperatively.  Chronic anemia. Mild thrombocytopenia--stable  Plan: Check orthostatics Fe SO4 BID Observe for ability to ambulate without hemodynamic instability. Reviewed R&B of blood transfusion--will defer at present and observe status as activity status advances. Advance diet this am, observe tolerance. Throat lozegens at bedside Continue current care  Nigel Bridgeman 08/29/2011, 7:51 AM

## 2011-08-30 ENCOUNTER — Encounter (HOSPITAL_COMMUNITY): Payer: Self-pay | Admitting: Obstetrics and Gynecology

## 2011-08-30 MED ORDER — LACTATED RINGERS IV SOLN: INTRAVENOUS | Status: DC

## 2011-08-30 NOTE — Progress Notes (Signed)
[name] is a 26 y.o. [id number] at [redacted]w[redacted]d b  Hospital Day No: postop day 2  Subjective: Passing gas, up walking, not dizzy  Prenatal labs:Marland Kitchen  Pregnancy complications: none  Objective: BP 106/64  Pulse 85  Temp(Src) 97.9 F (36.6 C) (Oral)  Resp 18  Wt 170 lb (77.111 kg)  SpO2 99%  Breastfeeding? Unknown I/O last 3 completed shifts: In: 2875 [P.O.:800; I.V.:2075] Out: 2400 [Urine:1800; Blood:600]    Physical Exam:  Gen: no distress resting in bed Chest/Lungs: cta bilaterally  Heart/Pulse: RRR  Abdomen: soft, gravid, nontender, BX x4 quad, typmpanic Uterine fundus: firm U -2, small serosa flow Skin & Color: warm and dry  Neurological: AOx3,  EXT: negative Homan's b/l, edema none    Labs: Lab Results  Component Value Date   WBC 13.3* 08/29/2011   HGB 7.6* 08/29/2011   HCT 24.6* 08/29/2011   MCV 68.7* 08/29/2011   PLT 140* 08/29/2011    Assessment and Plan: has Normal pregnancy; History of cesarean delivery; Anemia; Asthma; Sickle cell trait; ITP (idiopathic thrombocytopenic purpura); Abnormal Pap smear of cervix; History of oligohydramnios in prior pregnancy, currently pregnant; and Active labor on her problem list. Anemia Plan discussed blood transfusion risks benefits and declines, continue care, encouraged ambulation, plans mirena IUD, discharge tomorrow.  Ala Capri 08/30/2011, 11:13 AM

## 2011-08-31 DIAGNOSIS — Z98891 History of uterine scar from previous surgery: Secondary | ICD-10-CM

## 2011-08-31 MED ORDER — IBUPROFEN 600 MG PO TABS: 600.0000 mg | ORAL_TABLET | Freq: Four times a day (QID) | ORAL | Status: AC | PRN

## 2011-08-31 MED ORDER — OXYCODONE-ACETAMINOPHEN 5-325 MG PO TABS: 1.0000 | ORAL_TABLET | ORAL | Status: AC | PRN

## 2011-08-31 NOTE — Discharge Summary (Signed)
  Obstetric Discharge Summary Reason for Admission: onset of labor, planned repeat C/S Prenatal Procedures: NST and ultrasound Intrapartum Procedures: cesarean: low cervical, transverse Postpartum Procedures: none Complications-Operative and Postpartum: Chronic anemia  Temp:  [98 F (36.7 C)-98.3 F (36.8 C)] 98.3 F (36.8 C) (01/17 0548) Pulse Rate:  [80-105] 80  (01/17 0548) Resp:  [18] 18  (01/17 0548) BP: (104-109)/(67-68) 108/68 mmHg (01/17 0548) HGB  Date Value Range Status  07/13/2011 8.3* 11.6-15.9 (g/dL) Final     Hemoglobin  Date Value Range Status  08/29/2011 7.6* 12.0-15.0 (g/dL) Final     HCT  Date Value Range Status  08/29/2011 24.6* 36.0-46.0 (%) Final  07/13/2011 25.7* 34.8-46.6 (%) Final    Hospital Course:  Hospital Course: Admitted in labor 08/28/11, with observation in MAU and eventual cervical change documenting advancement of labor status.  Postive GBS.  Delivery was performed by Dr. Estanislado Pandy on 08/28/11 without difficulty, other than noted adhesions. Patient and baby tolerated the procedure without difficulty.  Infant to FTN. Mother and infant then had an uncomplicated postpartum course, with breast pumping going well (pumping due to nipple pain with latch). Mom's physical exam was WNL, and she was discharged home in stable condition. Contraception plan was Mirena, with eventual tubal ligation in later years.  She received adequate benefit from po pain medications.  She declined transfusion, and had no difficulty with syncope or dizziness.  Discharge Diagnoses: Term Pregnancy-delivered, Previous C/S--desired repeat.  Chronic anemia  Discharge Information: Date: 08/31/2011 Activity: Per [facility] handout Diet: routine Medications:  Medication List  As of 08/31/2011  9:17 AM   START taking these medications         ibuprofen 600 MG tablet   Commonly known as: ADVIL,MOTRIN   Take 1 tablet (600 mg total) by mouth every 6 (six) hours as needed for pain.     oxyCODONE-acetaminophen 5-325 MG per tablet   Commonly known as: PERCOCET   Take 1-2 tablets by mouth every 4 (four) hours as needed (moderate - severe pain).         CONTINUE taking these medications         ferrous gluconate 325 MG tablet   Commonly known as: FERGON      prenatal multivitamin Tabs      TERAZOL 7 0.4 % vaginal cream   Generic drug: terconazole          Where to get your medications    These are the prescriptions that you need to pick up.   You may get these medications from any pharmacy.         ibuprofen 600 MG tablet   oxyCODONE-acetaminophen 5-325 MG per tablet           Condition: stable Instructions: refer to practice specific booklet Discharge to: home Follow-up Information    Follow up with [facility] in 5 weeks.         Newborn Data: Live born  Information for the patient's newborn:  BERA, Girl Dawson [[id number]]  female ; APGAR 9/9 , ; weight 7+1 ;  Home with mother.  Nigel Bridgeman 08/31/2011, 9:17 AM

## 2011-09-04 ENCOUNTER — Inpatient Hospital Stay (HOSPITAL_COMMUNITY)
Admission: RE | Admit: 2011-09-04 | Payer: Medicaid Other | Source: Ambulatory Visit | Admitting: Obstetrics and Gynecology

## 2011-09-04 ENCOUNTER — Encounter (HOSPITAL_COMMUNITY): Admission: RE | Payer: Self-pay | Source: Ambulatory Visit

## 2011-09-04 SURGERY — Surgical Case
Anesthesia: Regional

## 2011-10-12 ENCOUNTER — Other Ambulatory Visit: Payer: Medicaid Other

## 2011-10-12 ENCOUNTER — Ambulatory Visit: Payer: Medicaid Other | Admitting: Oncology

## 2011-10-18 ENCOUNTER — Encounter (INDEPENDENT_AMBULATORY_CARE_PROVIDER_SITE_OTHER): Payer: Medicaid Other | Admitting: Registered Nurse

## 2011-10-18 DIAGNOSIS — Z3009 Encounter for other general counseling and advice on contraception: Secondary | ICD-10-CM

## 2011-11-01 ENCOUNTER — Encounter (INDEPENDENT_AMBULATORY_CARE_PROVIDER_SITE_OTHER): Payer: Medicaid Other | Admitting: Obstetrics and Gynecology

## 2011-11-01 DIAGNOSIS — B079 Viral wart, unspecified: Secondary | ICD-10-CM

## 2011-12-01 ENCOUNTER — Telehealth: Payer: Self-pay | Admitting: Obstetrics and Gynecology

## 2011-12-01 NOTE — Telephone Encounter (Signed)
Routed to triage nurse pool

## 2011-12-04 ENCOUNTER — Telehealth: Payer: Self-pay

## 2011-12-04 NOTE — Telephone Encounter (Signed)
LM for pt to cb, re the ? Need for an appt to discuss BC. Switch. Melody Comas A

## 2011-12-12 ENCOUNTER — Encounter: Payer: Medicaid Other | Admitting: Obstetrics and Gynecology

## 2011-12-20 ENCOUNTER — Ambulatory Visit (INDEPENDENT_AMBULATORY_CARE_PROVIDER_SITE_OTHER): Payer: Medicaid Other | Admitting: Obstetrics and Gynecology

## 2011-12-20 ENCOUNTER — Encounter: Payer: Self-pay | Admitting: Obstetrics and Gynecology

## 2011-12-20 VITALS — BP 122/68 | HR 68 | Resp 16 | Ht 64.0 in | Wt 150.0 lb

## 2011-12-20 DIAGNOSIS — N760 Acute vaginitis: Secondary | ICD-10-CM

## 2011-12-20 DIAGNOSIS — M940 Chondrocostal junction syndrome [Tietze]: Secondary | ICD-10-CM

## 2011-12-20 DIAGNOSIS — N762 Acute vulvitis: Secondary | ICD-10-CM

## 2011-12-20 DIAGNOSIS — N898 Other specified noninflammatory disorders of vagina: Secondary | ICD-10-CM

## 2011-12-20 DIAGNOSIS — Z309 Encounter for contraceptive management, unspecified: Secondary | ICD-10-CM

## 2011-12-20 LAB — POCT WET PREP (WET MOUNT): Clue Cells Wet Prep Whiff POC: NEGATIVE

## 2011-12-20 MED ORDER — LOESTRIN 24 FE 1-20 MG-MCG PO TABS: 1.0000 | ORAL_TABLET | Freq: Every day | ORAL | Status: DC

## 2011-12-20 MED ORDER — CLOTRIMAZOLE-BETAMETHASONE 1-0.05 % EX CREA: TOPICAL_CREAM | Freq: Every day | CUTANEOUS | Status: DC

## 2011-12-20 MED ORDER — IBUPROFEN 600 MG PO TABS: 600.0000 mg | ORAL_TABLET | Freq: Four times a day (QID) | ORAL | Status: AC | PRN

## 2011-12-20 NOTE — Progress Notes (Signed)
C/o pain at left upper breast.  C/o odor and d/c prior to menses started today.  Wants to change from Nuvaring.  Filed Vitals:   12/20/11 1620  BP: 122/68  Pulse: 68  Resp: 16   ROS: noncontributory  Breast: area of concern not actually in breast.  It is on rib.  Pelvic exam:  VULVA: normal appearing vulva with no masses, tenderness or lesions,  VAGINA: normal appearing vagina with normal color and discharge, no lesions, on menses CERVIX: normal appearing cervix without discharge or lesions,  UTERUS: uterus is normal size, shape, consistency and nontender,  ADNEXA: normal adnexa in size, nontender and no masses.   A/P Wet prep Vulvitis - lotrisone ?Costochondritis - motrin and f/u after menses next cycle (5wks) Contraception change from Nuvaring to Loestrin 24 - f/u in 

## 2012-04-23 ENCOUNTER — Ambulatory Visit: Payer: Medicaid Other | Admitting: Obstetrics and Gynecology

## 2012-05-13 ENCOUNTER — Ambulatory Visit: Payer: Medicaid Other | Admitting: Obstetrics and Gynecology

## 2012-05-27 ENCOUNTER — Encounter: Payer: Self-pay | Admitting: Obstetrics and Gynecology

## 2012-05-27 ENCOUNTER — Ambulatory Visit (INDEPENDENT_AMBULATORY_CARE_PROVIDER_SITE_OTHER): Payer: Medicaid Other | Admitting: Obstetrics and Gynecology

## 2012-05-27 VITALS — BP 112/60 | HR 68 | Resp 16 | Ht 64.0 in | Wt 149.0 lb

## 2012-05-27 DIAGNOSIS — Z113 Encounter for screening for infections with a predominantly sexual mode of transmission: Secondary | ICD-10-CM

## 2012-05-27 DIAGNOSIS — R300 Dysuria: Secondary | ICD-10-CM

## 2012-05-27 DIAGNOSIS — Z0000 Encounter for general adult medical examination without abnormal findings: Secondary | ICD-10-CM

## 2012-05-27 DIAGNOSIS — Z01419 Encounter for gynecological examination (general) (routine) without abnormal findings: Secondary | ICD-10-CM

## 2012-05-27 DIAGNOSIS — Z124 Encounter for screening for malignant neoplasm of cervix: Secondary | ICD-10-CM

## 2012-05-27 LAB — POCT WET PREP (WET MOUNT): Trichomonas Wet Prep HPF POC: POSITIVE

## 2012-05-27 LAB — POCT URINALYSIS DIPSTICK
Bilirubin, UA: NEGATIVE
Blood, UA: NEGATIVE
Ketones, UA: NEGATIVE
Leukocytes, UA: NEGATIVE
Spec Grav, UA: 1.01
pH, UA: 6

## 2012-05-27 LAB — HEPATITIS B SURFACE ANTIGEN: Hepatitis B Surface Ag: NEGATIVE

## 2012-05-27 MED ORDER — IMIQUIMOD 5 % EX CREA: TOPICAL_CREAM | CUTANEOUS | Status: DC

## 2012-05-27 MED ORDER — METRONIDAZOLE 500 MG PO TABS: 2000.0000 mg | ORAL_TABLET | Freq: Once | ORAL | Status: DC

## 2012-05-27 NOTE — Addendum Note (Signed)
Addended by: Marla Roe A on: 05/27/2012 12:38 PM   Modules accepted: Orders
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
## 2012-05-28 LAB — PAP IG, CT-NG, RFX HPV ASCU: Chlamydia Probe Amp: NEGATIVE

## 2012-05-28 LAB — HSV 1 ANTIBODY, IGG: HSV 1 Glycoprotein G Ab, IgG: 2.26 IV — ABNORMAL HIGH

## 2012-05-29 ENCOUNTER — Other Ambulatory Visit: Payer: Self-pay

## 2012-05-29 DIAGNOSIS — R109 Unspecified abdominal pain: Secondary | ICD-10-CM

## 2012-05-30 ENCOUNTER — Other Ambulatory Visit: Payer: Self-pay | Admitting: Obstetrics and Gynecology

## 2012-05-30 ENCOUNTER — Ambulatory Visit (INDEPENDENT_AMBULATORY_CARE_PROVIDER_SITE_OTHER): Payer: Medicaid Other

## 2012-05-30 ENCOUNTER — Ambulatory Visit (INDEPENDENT_AMBULATORY_CARE_PROVIDER_SITE_OTHER): Payer: Medicaid Other | Admitting: Obstetrics and Gynecology

## 2012-05-30 ENCOUNTER — Encounter: Payer: Self-pay | Admitting: Obstetrics and Gynecology

## 2012-05-30 VITALS — BP 122/64 | Ht 64.0 in | Wt 149.0 lb

## 2012-05-30 DIAGNOSIS — N949 Unspecified condition associated with female genital organs and menstrual cycle: Secondary | ICD-10-CM

## 2012-05-30 DIAGNOSIS — R109 Unspecified abdominal pain: Secondary | ICD-10-CM

## 2012-05-30 DIAGNOSIS — R102 Pelvic and perineal pain: Secondary | ICD-10-CM

## 2012-05-30 NOTE — Progress Notes (Signed)
Here to f/u u/s secondary to pelvic pain.  She said she had it with her last preg along linea alba.  She is satisfied with neg u/s.  Filed Vitals:   05/30/12 1139  BP: 122/64   U/S Ut 8.5x4.5x5.6, nl bila ovaries lt 2.7cm, rt 3.3cm  A/P LAbs from last visit reviewed and pap neg AEX in 26yr

## 2012-06-02 ENCOUNTER — Telehealth (HOSPITAL_COMMUNITY): Payer: Self-pay | Admitting: Obstetrics and Gynecology

## 2012-06-02 NOTE — Telephone Encounter (Signed)
Called with c/o of light breakthrough bleeding x 2 days with sm amt cramping.  Dx with trich and took Metronidazole 2000mg  on Monday.  Denies any missed or skipped pills. Denies increased or unusual vag d/c, itch, burn, odor at present and denies any IC since last OV.  ? If related to [place].  D/W pt to continue OCPs as scheduled.  Continue to observe bleeding.  RTO as scheduled for next visit or if persistent BTB.

## 2012-06-03 ENCOUNTER — Telehealth: Payer: Self-pay

## 2012-06-03 NOTE — Telephone Encounter (Signed)
Spoke to pt about labs, all WNL except HSV 1 +. Questions answered about BCP's. [name] A

## 2012-06-03 NOTE — Telephone Encounter (Signed)
LM for pt to cb for test results. [name], [name]  

## 2012-07-04 ENCOUNTER — Telehealth: Payer: Self-pay | Admitting: Obstetrics and Gynecology

## 2012-07-05 NOTE — Telephone Encounter (Signed)
Tc to pt per telephone call. Pt thinks has BV. Appt sched 07/10/12 @ 2:45p with EP for eval. Pt agrees.

## 2012-07-09 ENCOUNTER — Encounter: Payer: Medicaid Other | Admitting: Obstetrics and Gynecology

## 2012-07-26 ENCOUNTER — Encounter: Payer: Self-pay | Admitting: Obstetrics and Gynecology

## 2012-07-26 ENCOUNTER — Ambulatory Visit (INDEPENDENT_AMBULATORY_CARE_PROVIDER_SITE_OTHER): Payer: Medicaid Other | Admitting: Obstetrics and Gynecology

## 2012-07-26 VITALS — BP 110/70 | Temp 98.6°F | Wt 153.0 lb

## 2012-07-26 DIAGNOSIS — N898 Other specified noninflammatory disorders of vagina: Secondary | ICD-10-CM

## 2012-07-26 LAB — POCT WET PREP (WET MOUNT)
Trichomonas Wet Prep HPF POC: POSITIVE
Whiff Test: POSITIVE
pH: 5.5

## 2012-07-26 LAB — POCT OSOM TRICHOMONAS RAPID TEST: Trichomonas vaginalis: POSITIVE

## 2012-07-26 MED ORDER — TINIDAZOLE 500 MG PO TABS: 500.0000 mg | ORAL_TABLET | Freq: Two times a day (BID) | ORAL | Status: DC

## 2012-07-26 MED ORDER — FLUCONAZOLE 150 MG PO TABS: 150.0000 mg | ORAL_TABLET | Freq: Once | ORAL | Status: AC
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
## 2012-07-26 NOTE — Progress Notes (Signed)
Vaginal discharge: green and yellowthick mucoid Itching / Burning: yes Fever: no  Symptoms have been present for 3 weeks. Has used over-the-counter treatment: no Associated symptoms:  Pelvic pain: no       Dyspareunia: no     Odor:  yes  History of STD:  history of chlamydia and TRICH STD screen:CHECK FOR [facility]

## 2012-07-26 NOTE — Patient Instructions (Addendum)
Trichomoniasis Trichomoniasis is an infection, caused by the Trichomonas organism, that affects both women and men. In women, the outer female genitalia and the vagina are affected. In men, the penis is mainly affected, but the prostate and other reproductive organs can also be involved. Trichomoniasis is a sexually transmitted disease (STD) and is most often passed to another person through sexual contact. The majority of people who get trichomoniasis do so from a sexual encounter and are also at risk for other STDs. CAUSES   Sexual intercourse with an infected partner.  It can be present in swimming pools or hot tubs. SYMPTOMS   Abnormal gray-green frothy vaginal discharge in women.  Vaginal itching and irritation in women.  Itching and irritation of the area outside the vagina in women.  Penile discharge with or without pain in males.  Inflammation of the urethra (urethritis), causing painful urination.  Bleeding after sexual intercourse. RELATED COMPLICATIONS  Pelvic inflammatory disease.  Infection of the uterus (endometritis).  Infertility.  Tubal (ectopic) pregnancy.  It can be associated with other STDs, including gonorrhea and chlamydia, hepatitis B, and HIV. COMPLICATIONS DURING PREGNANCY  Early (premature) delivery.  Premature rupture of the membranes (PROM).  Low birth weight. DIAGNOSIS   Visualization of Trichomonas under the microscope from the vagina discharge.  Ph of the vagina greater than 4.5, tested with a test tape.  Trich Rapid Test.  Culture of the organism, but this is not usually needed.  It may be found on a Pap test.  Having a "strawberry cervix,"which means the cervix looks very red like a strawberry. TREATMENT   You may be given medication to fight the infection. Inform your caregiver if you could be or are pregnant. Some medications used to treat the infection should not be taken during pregnancy.  Over-the-counter medications or  creams to decrease itching or irritation may be recommended.  Your sexual partner will need to be treated if infected. HOME CARE INSTRUCTIONS   Take all medication prescribed by your caregiver.  Take over-the-counter medication for itching or irritation as directed by your caregiver.  Do not have sexual intercourse while you have the infection.  Do not douche or wear tampons.  Discuss your infection with your partner, as your partner may have acquired the infection from you. Or, your partner may have been the person who transmitted the infection to you.  Have your sex partner examined and treated if necessary.  Practice safe, informed, and protected sex.  See your caregiver for other STD testing. SEEK MEDICAL CARE IF:   You still have symptoms after you finish the medication.  You have an oral temperature above 102 F (38.9 C).  You develop belly (abdominal) pain.  You have pain when you urinate.  You have bleeding after sexual intercourse.  You develop a rash.  The medication makes you sick or makes you throw up (vomit). Document Released: 01/24/2001 Document Revised: 10/23/2011 Document Reviewed: 02/19/2009 Exit[employer] Patient Information 2013 [employer].  

## 2012-08-12 ENCOUNTER — Encounter: Payer: Medicaid Other | Admitting: Obstetrics and Gynecology

## 2012-08-15 ENCOUNTER — Encounter (HOSPITAL_COMMUNITY): Payer: Self-pay | Admitting: *Deleted

## 2012-08-15 ENCOUNTER — Emergency Department (HOSPITAL_COMMUNITY): Payer: Medicaid Other

## 2012-08-15 ENCOUNTER — Emergency Department (HOSPITAL_COMMUNITY)
Admission: EM | Admit: 2012-08-15 | Discharge: 2012-08-15 | Disposition: A | Discharge status: Home / Self Care | Payer: Medicaid Other | Attending: Emergency Medicine | Admitting: Emergency Medicine

## 2012-08-15 DIAGNOSIS — Z8619 Personal history of other infectious and parasitic diseases: Secondary | ICD-10-CM | POA: Insufficient documentation

## 2012-08-15 DIAGNOSIS — Z87440 Personal history of urinary (tract) infections: Secondary | ICD-10-CM | POA: Insufficient documentation

## 2012-08-15 DIAGNOSIS — S99919A Unspecified injury of unspecified ankle, initial encounter: Secondary | ICD-10-CM | POA: Insufficient documentation

## 2012-08-15 DIAGNOSIS — J45909 Unspecified asthma, uncomplicated: Secondary | ICD-10-CM | POA: Insufficient documentation

## 2012-08-15 DIAGNOSIS — S298XXA Other specified injuries of thorax, initial encounter: Secondary | ICD-10-CM | POA: Insufficient documentation

## 2012-08-15 DIAGNOSIS — D571 Sickle-cell disease without crisis: Secondary | ICD-10-CM | POA: Insufficient documentation

## 2012-08-15 DIAGNOSIS — S199XXA Unspecified injury of neck, initial encounter: Secondary | ICD-10-CM | POA: Insufficient documentation

## 2012-08-15 DIAGNOSIS — IMO0002 Reserved for concepts with insufficient information to code with codable children: Secondary | ICD-10-CM | POA: Insufficient documentation

## 2012-08-15 DIAGNOSIS — Z8742 Personal history of other diseases of the female genital tract: Secondary | ICD-10-CM | POA: Insufficient documentation

## 2012-08-15 DIAGNOSIS — S0993XA Unspecified injury of face, initial encounter: Secondary | ICD-10-CM | POA: Insufficient documentation

## 2012-08-15 DIAGNOSIS — S8990XA Unspecified injury of unspecified lower leg, initial encounter: Secondary | ICD-10-CM | POA: Insufficient documentation

## 2012-08-15 NOTE — ED Provider Notes (Signed)
Medical screening examination/treatment/procedure(s) were performed by non-physician practitioner and as supervising physician I was immediately available for consultation/collaboration.  [name], MD 08/15/12 [phone]
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
## 2012-12-16 IMAGING — US US FETAL BPP W/O NONSTRESS
1 series · 13 of 13 positions shown · non-contrast
Comparison: none

[Series 1: us fetal bpp w/o nonstress · non-contrast · 13 acquisitions, 13 frames shown]
[im 1/13]
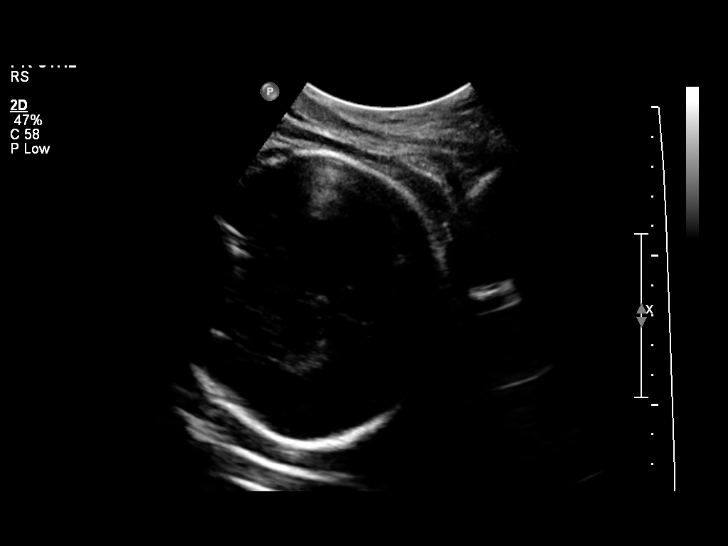
[im 2/13]
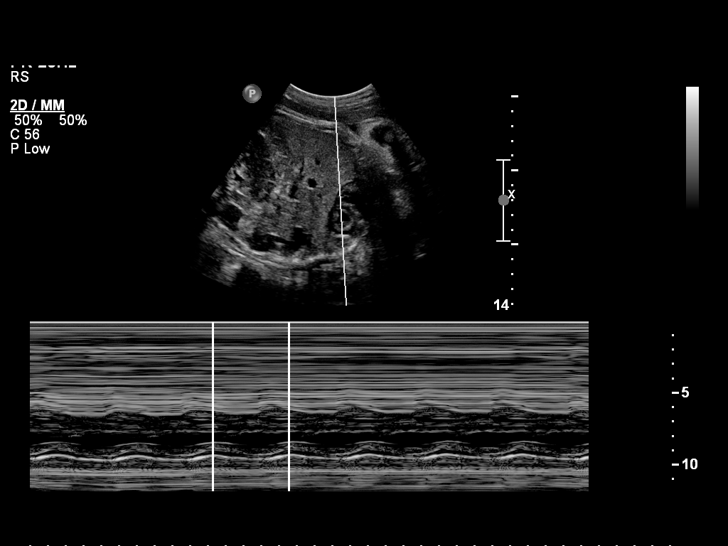
[im 3/13]
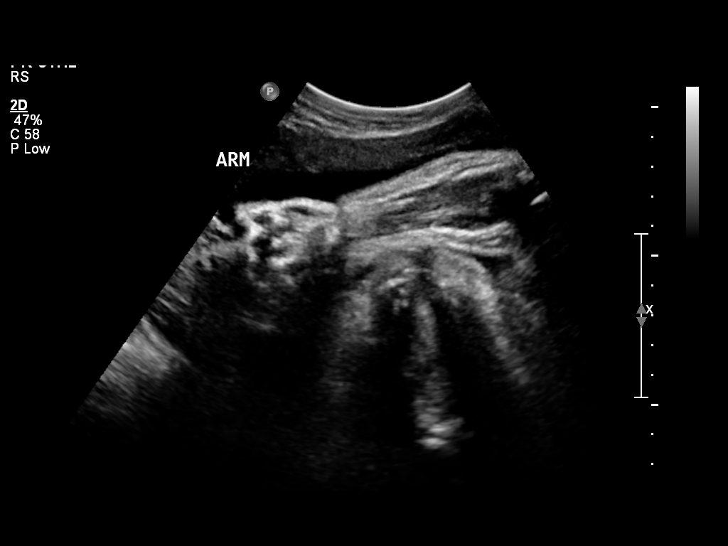
[im 4/13]
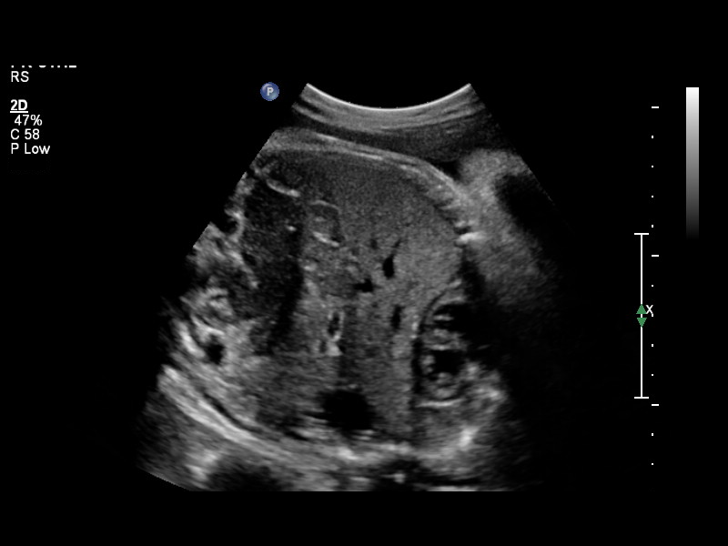
[im 5/13]
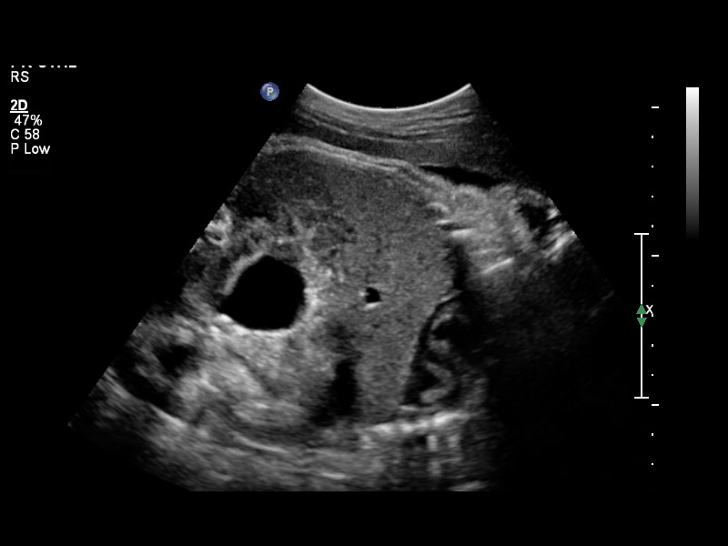
[im 6/13]
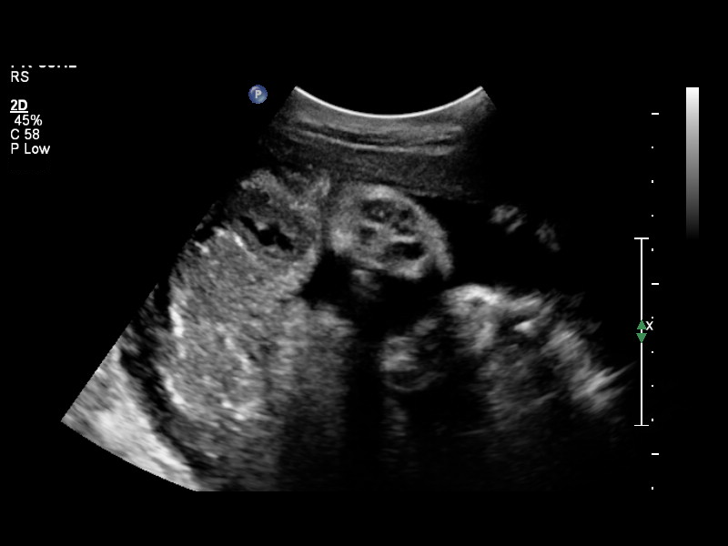
[im 7/13]
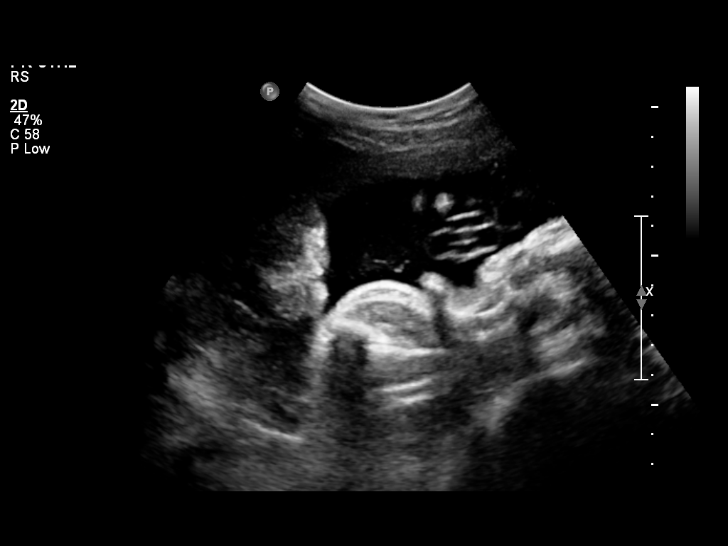
[im 8/13]
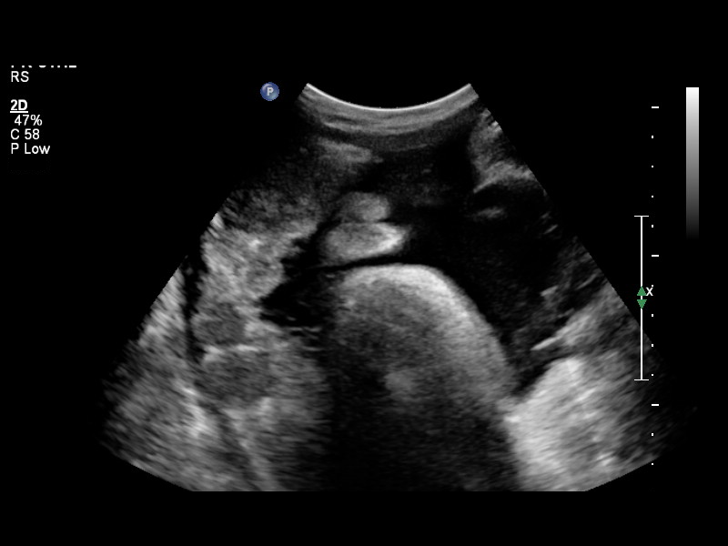
[im 9/13]
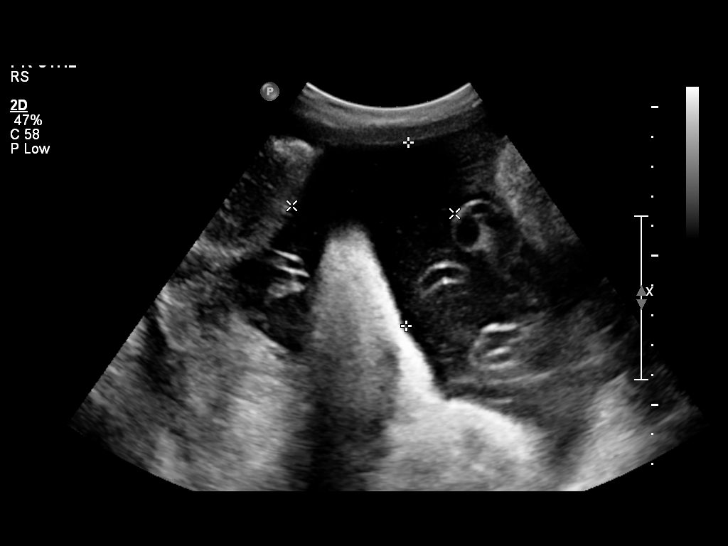
[im 10/13]
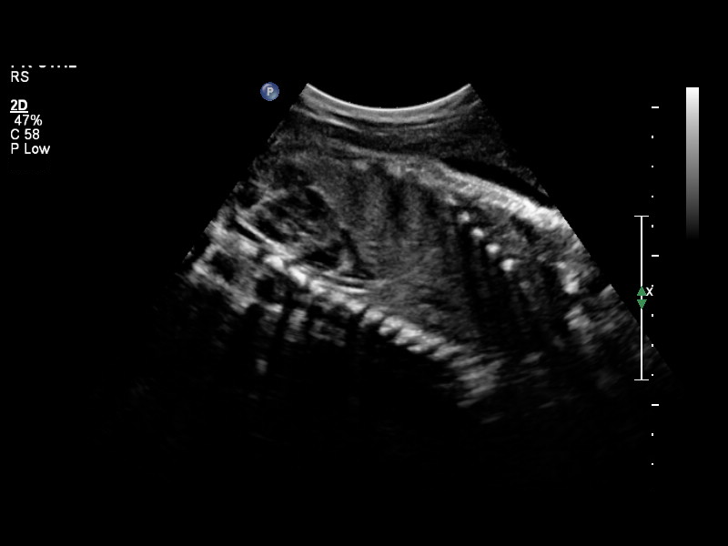
[im 11/13]
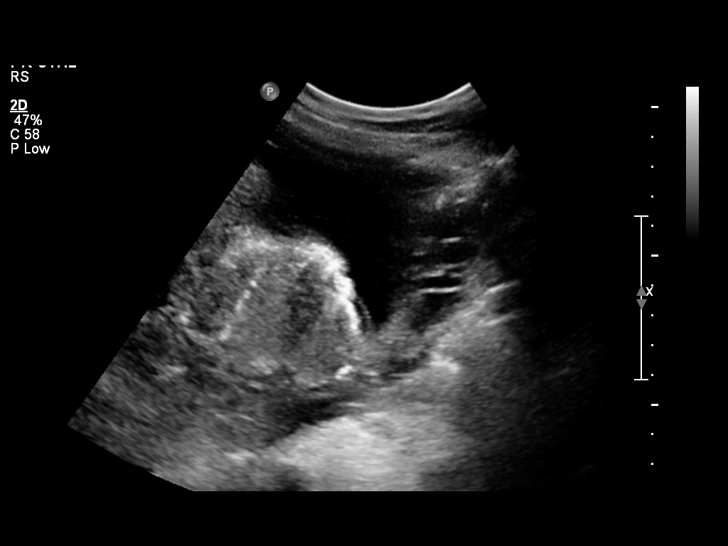
[im 12/13]
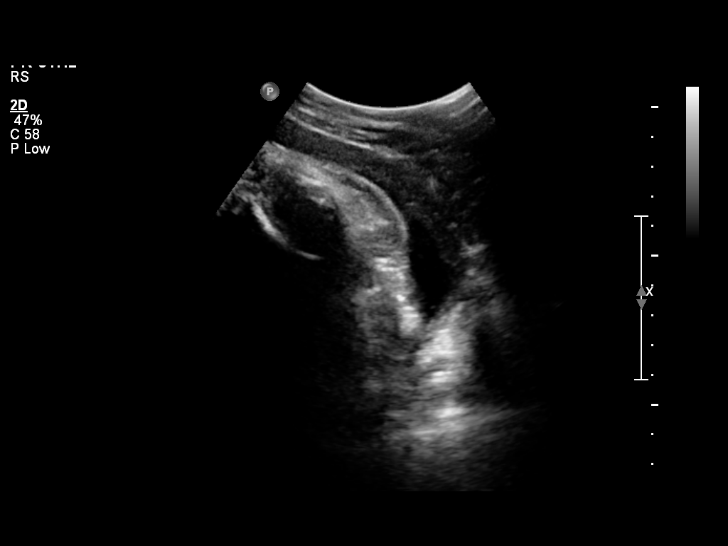
[im 13/13]
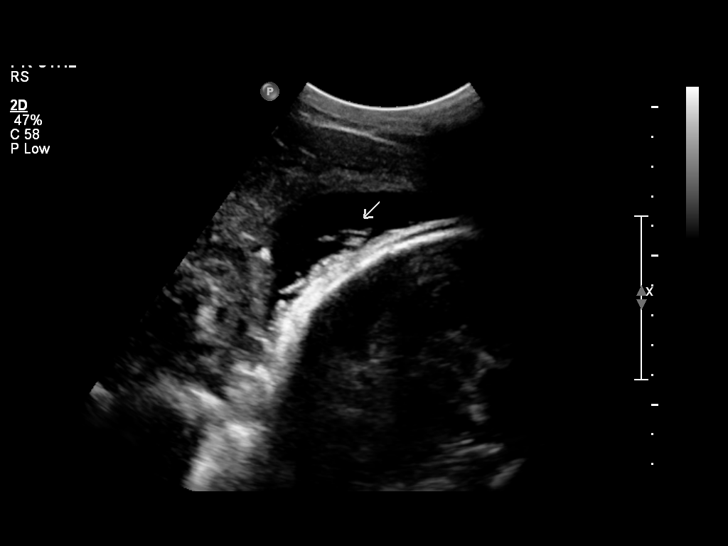

[13 of 13 positions shown; findings below may reference images not displayed]

OBSTETRICS REPORT
                      (Signed Final 08/23/2011 [DATE])

 Name:    GUUSJE ARJAANS                Visit Date: 08/22/2011 [DATE]

                 CNM
 Order#:         [id number]
Procedures

Indications

 Non-reactive NST
Fetal Evaluation

 Fetal Heart Rate:  140                         bpm
 Cardiac Activity:  Observed
 Presentation:      Cephalic

 Comment:    BPP time = 4 minutes

 Amniotic Fluid
 AFI FV:      Subjectively within normal limits
                                             Larg Pckt:     6.1  cm
Biophysical Evaluation

 Amniotic F.V:   Pocket => 2 cm two         F. Tone:        Observed
                 planes
 F. Movement:    Observed                   Score:          [DATE]
 F. Breathing:   Observed
Gestational Age

 LMP:           37w 5d       Date:   12/01/10                 EDD:   09/07/11
 Best:          37w 5d    Det. By:   LMP  (12/01/10)          EDD:   09/07/11
Impression

 Single living IUP with assigned GA of 37w 5d.
 BPP is [DATE].

 questions or concerns.

## 2013-02-25 ENCOUNTER — Encounter (HOSPITAL_COMMUNITY): Payer: Self-pay | Admitting: Family Medicine

## 2013-02-25 ENCOUNTER — Emergency Department (HOSPITAL_COMMUNITY)
Admission: EM | Admit: 2013-02-25 | Discharge: 2013-02-25 | Disposition: A | Discharge status: Home / Self Care | Payer: Medicaid Other | Attending: Emergency Medicine | Admitting: Emergency Medicine

## 2013-02-25 DIAGNOSIS — Z87440 Personal history of urinary (tract) infections: Secondary | ICD-10-CM | POA: Insufficient documentation

## 2013-02-25 DIAGNOSIS — R42 Dizziness and giddiness: Secondary | ICD-10-CM | POA: Insufficient documentation

## 2013-02-25 DIAGNOSIS — Z862 Personal history of diseases of the blood and blood-forming organs and certain disorders involving the immune mechanism: Secondary | ICD-10-CM | POA: Insufficient documentation

## 2013-02-25 DIAGNOSIS — Z3202 Encounter for pregnancy test, result negative: Secondary | ICD-10-CM | POA: Insufficient documentation

## 2013-02-25 DIAGNOSIS — Z8751 Personal history of pre-term labor: Secondary | ICD-10-CM | POA: Insufficient documentation

## 2013-02-25 DIAGNOSIS — R112 Nausea with vomiting, unspecified: Secondary | ICD-10-CM | POA: Insufficient documentation

## 2013-02-25 DIAGNOSIS — Z8619 Personal history of other infectious and parasitic diseases: Secondary | ICD-10-CM | POA: Insufficient documentation

## 2013-02-25 DIAGNOSIS — J45909 Unspecified asthma, uncomplicated: Secondary | ICD-10-CM | POA: Insufficient documentation

## 2013-02-25 DIAGNOSIS — R197 Diarrhea, unspecified: Secondary | ICD-10-CM | POA: Insufficient documentation

## 2013-02-25 DIAGNOSIS — H53149 Visual discomfort, unspecified: Secondary | ICD-10-CM | POA: Insufficient documentation

## 2013-02-25 DIAGNOSIS — R51 Headache: Secondary | ICD-10-CM | POA: Insufficient documentation

## 2013-02-25 LAB — PREGNANCY, URINE: Preg Test, Ur: NEGATIVE

## 2013-02-25 MED ORDER — DIPHENHYDRAMINE HCL 50 MG/ML IJ SOLN
25.0000 mg | Freq: Once | INTRAMUSCULAR | Status: AC
  Administered 2013-02-25: 25 mg via INTRAVENOUS
  Filled 2013-02-25: qty 1

## 2013-02-25 MED ORDER — METOCLOPRAMIDE HCL 5 MG/ML IJ SOLN
10.0000 mg | Freq: Once | INTRAMUSCULAR | Status: AC
  Administered 2013-02-25: 10 mg via INTRAVENOUS
  Filled 2013-02-25: qty 2

## 2013-02-25 MED ORDER — KETOROLAC TROMETHAMINE 30 MG/ML IJ SOLN
30.0000 mg | Freq: Once | INTRAMUSCULAR | Status: AC
  Administered 2013-02-25: 30 mg via INTRAVENOUS
  Filled 2013-02-25: qty 1

## 2013-02-25 MED ORDER — ONDANSETRON 4 MG PO TBDP: 4.0000 mg | ORAL_TABLET | Freq: Three times a day (TID) | ORAL | Status: DC | PRN

## 2013-02-25 MED ORDER — SODIUM CHLORIDE 0.9 % IV BOLUS (SEPSIS)
1000.0000 mL | Freq: Once | INTRAVENOUS | Status: AC
  Administered 2013-02-25: 1000 mL via INTRAVENOUS

## 2013-02-25 NOTE — ED Provider Notes (Signed)
History    CSN: [id number] Arrival date & time 02/25/13  1005  First MD Initiated Contact with Patient 02/25/13 1113     Chief Complaint  Patient presents with  . Headache   (Consider location/radiation/quality/duration/timing/severity/associated sxs/prior Treatment) HPI Comments: Patient presents today with a chief complaint of headache.  Headache gradual in onset.  Headache located in the bilateral temple region.  She describes the pain as a "throbbing" pain.  Pain does not radiate.  She reports that the headache has been intermittent since yesterday.  She has had some associated nausea, vomiting, dizziness, and photophobia.  She denies fever, neck pain/stiffness, or diplopia.  She reports that the headache is similar to headaches that she has had in the past.  She reports that she has taken Excedrin, Tylenol, and Advil without relief.    Patient is a 27 y.o. female presenting with headaches. The history is provided by the patient.  Headache Associated symptoms: photophobia   Associated symptoms: no blurred vision, no pain, no facial pain, no fever, no focal weakness, no loss of balance, no neck pain, no neck stiffness, no numbness, no paresthesias, no sore throat, no syncope and no weakness    Past Medical History  Diagnosis Date  . Bacterial vaginosis   . Yeast vaginitis   . UTI (urinary tract infection)   . Asthma   . Thrombocytopenia   . Sickle cell disease   . Preterm labor   . Sickle cell trait   . Abnormal Pap smear   . Chlamydia   . HPV (human papilloma virus) infection    Past Surgical History  Procedure Laterality Date  . Cesarean section    . Eye surgery    . Eye surgery    . Cesarean section  08/28/2011    Procedure: CESAREAN SECTION;  Surgeon: Esmeralda Arthur, MD;  Location: [facility];  Service: Gynecology;  Laterality: N/A;   Family History  Problem Relation Age of Onset  . Cancer Paternal Uncle   . Cancer Maternal Grandmother   . Cancer Paternal Grandfather    . Anesthesia problems Neg Hx    History  Substance Use Topics  . Smoking status: Never Smoker   . Smokeless tobacco: Never Used  . Alcohol Use: No     Comment: none with preg   OB History   Grav Para Term Preterm Abortions TAB SAB Ect Mult Living   4 2 1 1 2  2   2      Review of Systems  Constitutional: Negative for fever and chills.  HENT: Negative for sore throat, neck pain and neck stiffness.   Eyes: Positive for photophobia. Negative for blurred vision and pain.  Cardiovascular: Negative for syncope.  Skin: Negative for rash.  Neurological: Positive for headaches. Negative for focal weakness, syncope, facial asymmetry, light-headedness, numbness, paresthesias and loss of balance.  Psychiatric/Behavioral: Negative for confusion.  All other systems reviewed and are negative.    Allergies  Sulfa antibiotics  Home Medications   Current Outpatient Rx  Name  Route  Sig  Dispense  Refill  . Loratadine (CLARITIN PO)   Oral   Take 1 tablet by mouth daily as needed.          BP 116/64  Pulse 92  Temp(Src) 99.1 F (37.3 C) (Oral)  Resp 16  SpO2 100%  LMP 02/18/2013 Physical Exam  Nursing note and vitals reviewed. Constitutional: She appears well-developed and well-nourished.  HENT:  Head: Normocephalic and atraumatic.  Eyes: EOM  are normal. Pupils are equal, round, and reactive to light.  Neck: Normal range of motion. Neck supple.  Cardiovascular: Normal rate, regular rhythm and normal heart sounds.   Pulmonary/Chest: Effort normal and breath sounds normal.  Abdominal: Soft. Bowel sounds are normal.  Neurological: She is alert. She has normal strength. No cranial nerve deficit or sensory deficit. She displays a negative Romberg sign. Coordination and gait normal.  Normal finger to nose testing bilaterally Normal rapid alternating movements  Skin: Skin is warm and dry.  Psychiatric: She has a normal mood and affect.    ED Course  Procedures (including  critical care time) Labs Reviewed - No data to display No results found. No diagnosis found.  12:36 PM Reassessed patient.  She reports that her headache has resolved at this time. MDM  Pt HA treated and improved while in ED.  Presentation is like pts typical HA and non concerning for [facility], ICH, Meningitis, or temporal arteritis. Pt is afebrile with no focal neuro deficits, nuchal rigidity, or change in vision. Pt is to follow up with PCP to discuss prophylactic medication. Pt verbalizes understanding and is agreeable with plan to dc.   Pascal Lux Prescott, PA-C 02/25/13 [phone]

## 2013-02-25 NOTE — ED Notes (Signed)
Per pt sts HA, vomiting and chills. sts started yesterday. sts OTC meds not working

## 2013-02-25 NOTE — ED Provider Notes (Signed)
Medical screening examination/treatment/procedure(s) were performed by non-physician practitioner and as supervising physician I was immediately available for consultation/collaboration.  Kannan Proia L Deborrah Mabin, MD 02/25/13 2022 

## 2013-03-02 ENCOUNTER — Inpatient Hospital Stay (HOSPITAL_COMMUNITY)
Admission: AD | Admit: 2013-03-02 | Discharge: 2013-03-02 | Disposition: A | Discharge status: Home / Self Care | Payer: Medicaid Other | Source: Ambulatory Visit | Attending: Obstetrics and Gynecology | Admitting: Obstetrics and Gynecology

## 2013-03-02 ENCOUNTER — Encounter (HOSPITAL_COMMUNITY): Payer: Self-pay | Admitting: *Deleted

## 2013-03-02 ENCOUNTER — Inpatient Hospital Stay (HOSPITAL_COMMUNITY): Payer: Medicaid Other

## 2013-03-02 DIAGNOSIS — Z3202 Encounter for pregnancy test, result negative: Secondary | ICD-10-CM | POA: Insufficient documentation

## 2013-03-02 DIAGNOSIS — R51 Headache: Secondary | ICD-10-CM | POA: Insufficient documentation

## 2013-03-02 DIAGNOSIS — B9689 Other specified bacterial agents as the cause of diseases classified elsewhere: Secondary | ICD-10-CM | POA: Insufficient documentation

## 2013-03-02 DIAGNOSIS — N760 Acute vaginitis: Secondary | ICD-10-CM | POA: Insufficient documentation

## 2013-03-02 DIAGNOSIS — A499 Bacterial infection, unspecified: Secondary | ICD-10-CM | POA: Insufficient documentation

## 2013-03-02 DIAGNOSIS — R509 Fever, unspecified: Secondary | ICD-10-CM | POA: Insufficient documentation

## 2013-03-02 LAB — URINALYSIS, ROUTINE W REFLEX MICROSCOPIC
Bilirubin Urine: NEGATIVE
Glucose, UA: NEGATIVE mg/dL
Specific Gravity, Urine: 1.015 (ref 1.005–1.030)
pH: 6 (ref 5.0–8.0)

## 2013-03-02 LAB — POCT PREGNANCY, URINE: Preg Test, Ur: NEGATIVE

## 2013-03-02 LAB — COMPREHENSIVE METABOLIC PANEL
ALT: 9 U/L (ref 0–35)
Alkaline Phosphatase: 55 U/L (ref 39–117)
CO2: 27 mEq/L (ref 19–32)
Calcium: 8.6 mg/dL (ref 8.4–10.5)
Chloride: 97 mEq/L (ref 96–112)
GFR calc Af Amer: >90 mL/min (ref >90)
GFR calc non Af Amer: >90 mL/min (ref >90)
Glucose, Bld: 91 mg/dL (ref 70–99)
Sodium: 134 mEq/L — ABNORMAL LOW (ref 135–145)
Total Bilirubin: 0.5 mg/dL (ref 0.3–1.2)

## 2013-03-02 LAB — CBC WITH DIFFERENTIAL/PLATELET
Basophils Relative: 0 % (ref 0–1)
Eosinophils Relative: 0 % (ref 0–5)
Hemoglobin: 8 g/dL — ABNORMAL LOW (ref 12.0–15.0)
MCH: 21.1 pg — ABNORMAL LOW (ref 26.0–34.0)
MCV: 66.6 fL — ABNORMAL LOW (ref 78.0–100.0)
Monocytes Absolute: 1 10*3/uL (ref 0.1–1.0)
Neutrophils Relative %: 73 % (ref 43–77)
RBC: 3.8 MIL/uL — ABNORMAL LOW (ref 3.87–5.11)

## 2013-03-02 LAB — URINE MICROSCOPIC-ADD ON

## 2013-03-02 LAB — WET PREP, GENITAL

## 2013-03-02 MED ORDER — NITROFURANTOIN MONOHYD MACRO 100 MG PO CAPS: 100.0000 mg | ORAL_CAPSULE | Freq: Two times a day (BID) | ORAL | Status: DC

## 2013-03-02 MED ORDER — ACETAMINOPHEN 325 MG PO TABS
650.0000 mg | ORAL_TABLET | Freq: Four times a day (QID) | ORAL | Status: DC | PRN
  Administered 2013-03-02: 650 mg via ORAL
  Filled 2013-03-02: qty 2

## 2013-03-02 MED ORDER — LACTATED RINGERS IV BOLUS (SEPSIS)
1000.0000 mL | Freq: Once | INTRAVENOUS | Status: AC
  Administered 2013-03-02: 1000 mL via INTRAVENOUS

## 2013-03-02 MED ORDER — IBUPROFEN 600 MG PO TABS
600.0000 mg | ORAL_TABLET | Freq: Four times a day (QID) | ORAL | Status: DC | PRN
  Administered 2013-03-02: 600 mg via ORAL
  Filled 2013-03-02: qty 1

## 2013-03-02 MED ORDER — OXYCODONE-ACETAMINOPHEN 5-325 MG PO TABS
1.0000 | ORAL_TABLET | Freq: Once | ORAL | Status: AC
  Administered 2013-03-02: 1 via ORAL
  Filled 2013-03-02: qty 1

## 2013-03-02 MED ORDER — CEFAZOLIN SODIUM 1-5 GM-% IV SOLN
1.0000 g | Freq: Three times a day (TID) | INTRAVENOUS | Status: DC
  Administered 2013-03-02: 1 g via INTRAVENOUS
  Filled 2013-03-02 (×2): qty 50

## 2013-03-02 MED ORDER — HYDROCODONE-ACETAMINOPHEN 5-325 MG PO TABS: 1.0000 | ORAL_TABLET | Freq: Four times a day (QID) | ORAL | Status: DC | PRN

## 2013-03-02 MED ORDER — OXYCODONE-ACETAMINOPHEN 5-325 MG PO TABS: 1.0000 | ORAL_TABLET | Freq: Once | ORAL | Status: DC

## 2013-03-02 MED ORDER — SODIUM CHLORIDE 0.9 % IJ SOLN
INTRAMUSCULAR | Status: AC
  Filled 2013-03-02: qty 3

## 2013-03-02 MED ORDER — METRONIDAZOLE 500 MG PO TABS: 500.0000 mg | ORAL_TABLET | Freq: Two times a day (BID) | ORAL | Status: AC

## 2013-03-02 MED ORDER — OXYCODONE HCL 5 MG PO TABS
5.0000 mg | ORAL_TABLET | Freq: Once | ORAL | Status: DC
  Filled 2013-03-02: qty 1

## 2013-03-02 MED ORDER — OXYCODONE HCL 5 MG PO TABS
5.0000 mg | ORAL_TABLET | Freq: Once | ORAL | Status: AC
  Administered 2013-03-02: 5 mg via ORAL

## 2013-03-02 MED ORDER — LACTATED RINGERS IV SOLN
INTRAVENOUS | Status: DC
  Administered 2013-03-02 (×2): via INTRAVENOUS

## 2013-03-02 NOTE — MAU Provider Note (Signed)
History   27yo pt presents with c/o chills since Tuesday and fever and HA on and off since Wed.  Denies UTI, URI type sx, or N/V.  Mother is sick with cold symptoms but no fever or chills. Sexually active, no current use of BC, recent UPT on Tues neg.  Seen at [facility] on Tuesday with a Migraine HA  Chief Complaint  Patient presents with  . Fever  . Chest Pain    OB History   Grav Para Term Preterm Abortions TAB SAB Ect Mult Living   5 2 1 1 3 1 2   2       Past Medical History  Diagnosis Date  . Bacterial vaginosis   . Yeast vaginitis   . UTI (urinary tract infection)   . Asthma   . Thrombocytopenia   . Sickle cell disease   . Preterm labor   . Sickle cell trait   . Abnormal Pap smear   . Chlamydia   . HPV (human papilloma virus) infection     Past Surgical History  Procedure Laterality Date  . Cesarean section    . Eye surgery    . Eye surgery    . Cesarean section  08/28/2011    Procedure: CESAREAN SECTION;  Surgeon: Esmeralda Arthur, MD;  Location: [facility];  Service: Gynecology;  Laterality: N/A;    Family History  Problem Relation Age of Onset  . Cancer Paternal Uncle   . Cancer Maternal Grandmother   . Cancer Paternal Grandfather   . Anesthesia problems Neg Hx     History  Substance Use Topics  . Smoking status: Never Smoker   . Smokeless tobacco: Never Used  . Alcohol Use: No     Comment: none with preg    Allergies:  Allergies  Allergen Reactions  . Sulfa Antibiotics Hives and Itching    Prescriptions prior to admission  Medication Sig Dispense Refill  . acetaminophen (TYLENOL) 325 MG tablet Take 650 mg by mouth every 6 (six) hours as needed for pain.      . Aspirin-Acetaminophen-Caffeine (EXCEDRIN PO) Take 1 tablet by mouth daily as needed (heachache).      . Loratadine (CLARITIN PO) Take 1 tablet by mouth daily as needed.        ROS: see HPI above, all other systems are negative   Physical Exam   Blood pressure 127/72, pulse 94, temperature  102.6 F (39.2 C), temperature source Oral, resp. rate 18, last menstrual period 02/18/2013, SpO2 98.00%.  Chest: Clear Heart: RRR Abdomen: gravid, NT Extremities: WNL No CVAT  Pelvic exam: normal external genitalia, vulva, vagina, cervix, uterus and adnexa, vaginal discharge - milky. No cervical motion tenderness.  Filed Vitals:   03/02/13 1531 03/02/13 1727 03/02/13 1811 03/02/13 1925  BP:   134/75 126/70  Pulse:   89 75  Temp: 101.2 F (38.4 C) 101.1 F (38.4 C) 99.4 F (37.4 C) 99.5 F (37.5 C)  TempSrc: Oral  Oral Oral  Resp:   18 18  SpO2:   100% 98%    Results for orders placed during the hospital encounter of 03/02/13 (from the past 24 hour(s))  URINALYSIS, ROUTINE W REFLEX MICROSCOPIC     Status: Abnormal   Collection Time    03/02/13 12:20 PM      Result Value Range   Color, Urine YELLOW  YELLOW   APPearance CLEAR  CLEAR   Specific Gravity, Urine 1.015  1.005 - 1.030   pH 6.0  5.0 -  8.0   Glucose, UA NEGATIVE  NEGATIVE mg/dL   Hgb urine dipstick TRACE (*) NEGATIVE   Bilirubin Urine NEGATIVE  NEGATIVE   Ketones, ur NEGATIVE  NEGATIVE mg/dL   Protein, ur NEGATIVE  NEGATIVE mg/dL   Urobilinogen, UA 0.2  0.0 - 1.0 mg/dL   Nitrite POSITIVE (*) NEGATIVE   Leukocytes, UA SMALL (*) NEGATIVE  URINE MICROSCOPIC-ADD ON     Status: Abnormal   Collection Time    03/02/13 12:20 PM      Result Value Range   Squamous Epithelial / LPF FEW (*) RARE   WBC, UA 11-20  <3 WBC/hpf   RBC / HPF 0-2  <3 RBC/hpf   Bacteria, UA MANY (*) RARE  CBC WITH DIFFERENTIAL     Status: Abnormal   Collection Time    03/02/13  2:59 PM      Result Value Range   WBC 10.4  4.0 - 10.5 K/uL   RBC 3.80 (*) 3.87 - 5.11 MIL/uL   Hemoglobin 8.0 (*) 12.0 - 15.0 g/dL   HCT 16.1 (*) 09.6 - 04.5 %   MCV 66.6 (*) 78.0 - 100.0 fL   MCH 21.1 (*) 26.0 - 34.0 pg   MCHC 31.6  30.0 - 36.0 g/dL   RDW 40.9 (*) 81.1 - 91.4 %   Platelets 205  150 - 400 K/uL   Neutrophils Relative % 73  43 - 77 %    Lymphocytes Relative 17  12 - 46 %   Monocytes Relative 10  3 - 12 %   Eosinophils Relative 0  0 - 5 %   Basophils Relative 0  0 - 1 %   Neutro Abs 7.6  1.7 - 7.7 K/uL   Lymphs Abs 1.8  0.7 - 4.0 K/uL   Monocytes Absolute 1.0  0.1 - 1.0 K/uL   Eosinophils Absolute 0.0  0.0 - 0.7 K/uL   Basophils Absolute 0.0  0.0 - 0.1 K/uL   RBC Morphology SCHISTOCYTES PRESENT (2-5/hpf)    COMPREHENSIVE METABOLIC PANEL     Status: Abnormal   Collection Time    03/02/13  2:59 PM      Result Value Range   Sodium 134 (*) 135 - 145 mEq/L   Potassium 2.9 (*) 3.5 - 5.1 mEq/L   Chloride 97  96 - 112 mEq/L   CO2 27  19 - 32 mEq/L   Glucose, Bld 91  70 - 99 mg/dL   BUN 4 (*) 6 - 23 mg/dL   Creatinine, Ser 7.82  0.50 - 1.10 mg/dL   Calcium 8.6  8.4 - 95.6 mg/dL   Total Protein 6.6  6.0 - 8.3 g/dL   Albumin 3.2 (*) 3.5 - 5.2 g/dL   AST 15  0 - 37 U/L   ALT 9  0 - 35 U/L   Alkaline Phosphatase 55  39 - 117 U/L   Total Bilirubin 0.5  0.3 - 1.2 mg/dL   GFR calc non Af Amer >90  >90 mL/min   GFR calc Af Amer >90  >90 mL/min  WET PREP, GENITAL     Status: Abnormal   Collection Time    03/02/13  4:45 PM      Result Value Range   Yeast Wet Prep HPF POC NONE SEEN  NONE SEEN   Trich, Wet Prep NONE SEEN  NONE SEEN   Clue Cells Wet Prep HPF POC FEW (*) NONE SEEN   WBC, Wet Prep HPF POC FEW (*) NONE SEEN  POCT PREGNANCY, URINE     Status: None   Collection Time    03/02/13  5:44 PM      Result Value Range   Preg Test, Ur NEGATIVE  NEGATIVE    ED Course  Fever  Percocet  1 tab once for HA LR bolus CBC CMET  C/w Dr. Pennie Rushing Pelvic exam Wet prep - + BV; rx Metronidazole 500 mg BID PO x 7days GC/CT culture - pending UPT - neg  C/w Dr. Pennie Rushing Transfer to [facility] for further evaluation    Haroldine Laws CNM, MSN 03/02/2013 3:04 PM

## 2013-03-02 NOTE — MAU Note (Signed)
Pt. Here due to tightness in chest and fever. Started to have fever Wednesday and noticed tightness in chest today. Was treated for a migraine at [facility] on Tuesday of this week. Denies pregnancy. Does report a headache at this time. Denies nausea or vomiting. Does not have a frequent history of migraines. Pt. Also states has been around mom who has been sick with cough and congestion.

## 2013-03-02 NOTE — MAU Provider Note (Signed)
Has received IV hydration, Ibuprophen 600 mg po at 6:08pm.  Temp 99.5 at 7:25pm (initial temp on presentation at 12:21pm was 102.6  Filed Vitals:   03/02/13 1531 03/02/13 1727 03/02/13 1811 03/02/13 1925  BP:   134/75 126/70  Pulse:   89 75  Temp: 101.2 F (38.4 C) 101.1 F (38.4 C) 99.4 F (37.4 C) 99.5 F (37.5 C)  TempSrc: Oral  Oral Oral  Resp:   18 18  SpO2:   100% 98%   Physical Exam: Patient with nasal stuffiness, but reports that has been because she has been crying--not in pain. Reported to me had neck stiffness yesterday--had not reported that in previous interviews. Chest clear Heart RRR without murmur Abd soft, NT Pelvic--deferred to previous exam Ext WNL  Results for orders placed during the hospital encounter of 03/02/13 (from the past 24 hour(s))  URINALYSIS, ROUTINE W REFLEX MICROSCOPIC     Status: Abnormal   Collection Time    03/02/13 12:20 PM      Result Value Range   Color, Urine YELLOW  YELLOW   APPearance CLEAR  CLEAR   Specific Gravity, Urine 1.015  1.005 - 1.030   pH 6.0  5.0 - 8.0   Glucose, UA NEGATIVE  NEGATIVE mg/dL   Hgb urine dipstick TRACE (*) NEGATIVE   Bilirubin Urine NEGATIVE  NEGATIVE   Ketones, ur NEGATIVE  NEGATIVE mg/dL   Protein, ur NEGATIVE  NEGATIVE mg/dL   Urobilinogen, UA 0.2  0.0 - 1.0 mg/dL   Nitrite POSITIVE (*) NEGATIVE   Leukocytes, UA SMALL (*) NEGATIVE  URINE MICROSCOPIC-ADD ON     Status: Abnormal   Collection Time    03/02/13 12:20 PM      Result Value Range   Squamous Epithelial / LPF FEW (*) RARE   WBC, UA 11-20  <3 WBC/hpf   RBC / HPF 0-2  <3 RBC/hpf   Bacteria, UA MANY (*) RARE  CBC WITH DIFFERENTIAL     Status: Abnormal   Collection Time    03/02/13  2:59 PM      Result Value Range   WBC 10.4  4.0 - 10.5 K/uL   RBC 3.80 (*) 3.87 - 5.11 MIL/uL   Hemoglobin 8.0 (*) 12.0 - 15.0 g/dL   HCT 16.1 (*) 09.6 - 04.5 %   MCV 66.6 (*) 78.0 - 100.0 fL   MCH 21.1 (*) 26.0 - 34.0 pg   MCHC 31.6  30.0 - 36.0 g/dL   RDW 40.9 (*) 81.1 - 91.4 %   Platelets 205  150 - 400 K/uL   Neutrophils Relative % 73  43 - 77 %   Lymphocytes Relative 17  12 - 46 %   Monocytes Relative 10  3 - 12 %   Eosinophils Relative 0  0 - 5 %   Basophils Relative 0  0 - 1 %   Neutro Abs 7.6  1.7 - 7.7 K/uL   Lymphs Abs 1.8  0.7 - 4.0 K/uL   Monocytes Absolute 1.0  0.1 - 1.0 K/uL   Eosinophils Absolute 0.0  0.0 - 0.7 K/uL   Basophils Absolute 0.0  0.0 - 0.1 K/uL   RBC Morphology SCHISTOCYTES PRESENT (2-5/hpf)    COMPREHENSIVE METABOLIC PANEL     Status: Abnormal   Collection Time    03/02/13  2:59 PM      Result Value Range   Sodium 134 (*) 135 - 145 mEq/L   Potassium 2.9 (*) 3.5 - 5.1 mEq/L  Chloride 97  96 - 112 mEq/L   CO2 27  19 - 32 mEq/L   Glucose, Bld 91  70 - 99 mg/dL   BUN 4 (*) 6 - 23 mg/dL   Creatinine, Ser 4.78  0.50 - 1.10 mg/dL   Calcium 8.6  8.4 - 29.5 mg/dL   Total Protein 6.6  6.0 - 8.3 g/dL   Albumin 3.2 (*) 3.5 - 5.2 g/dL   AST 15  0 - 37 U/L   ALT 9  0 - 35 U/L   Alkaline Phosphatase 55  39 - 117 U/L   Total Bilirubin 0.5  0.3 - 1.2 mg/dL   GFR calc non Af Amer >90  >90 mL/min   GFR calc Af Amer >90  >90 mL/min  WET PREP, GENITAL     Status: Abnormal   Collection Time    03/02/13  4:45 PM      Result Value Range   Yeast Wet Prep HPF POC NONE SEEN  NONE SEEN   Trich, Wet Prep NONE SEEN  NONE SEEN   Clue Cells Wet Prep HPF POC FEW (*) NONE SEEN   WBC, Wet Prep HPF POC FEW (*) NONE SEEN  POCT PREGNANCY, URINE     Status: None   Collection Time    03/02/13  5:44 PM      Result Value Range   Preg Test, Ur NEGATIVE  NEGATIVE   Consulted with Dr. Pennie Rushing.   Considering transfer, but will give single dose of Ancef 1 gm IV now and obtain CXR. Will recheck temp 1 hour after Ancef infused, and await CXR result. If temp down after ATB, and CXR negative, will send home with Rx for Macrobid BID x 7 days, and plan to f/u in office with [facility]. Patient agreeable with plan.  Nigel Bridgeman, CNM 03/02/13  7:50p  Addendum:  CXR WNL  Filed Vitals:   03/02/13 1811 03/02/13 1925 03/02/13 2101 03/02/13 2111  BP: 134/75 126/70  115/59  Pulse: 89 75  70  Temp: 99.4 F (37.4 C) 99.5 F (37.5 C) 97.7 F (36.5 C)   TempSrc: Oral Oral Oral   Resp: 18 18 16 18   SpO2: 100% 98%     Still has HA, but alert and oriented, in good spirits. Oxycodone 5 mg po given.  D/C'd home due to decreased fever, negative CXR, no evidence of sepsis. Had been seen at [facility] ER this week for HA--referred to primary. Patient advises she does not have primary MD, and has had no other MD managing her chronic HAs. Rx Macrobid BID x 7 days Rx Vicodin 5/325 1 po q 6 hours prn HA, #30, no refills. Rx Metronidazole 500 mg po BID x 7 days (called to patient's pharmacy by Haroldine Laws, CNM). To f/u with [facility] (Dr. Su Hilt) as needed for gyn issues, or if UTI sx increase. To f/u with ER or Urgent Care if HA does not improve in next 24 hours, or if sx increase. Patient agreeable with plan.  Nigel Bridgeman, CNM 03/02/13 9:30pm

## 2013-03-02 NOTE — MAU Note (Signed)
Pt presents with complaints of a fever and tightness in her chest, states that she ws treated at [facility] on Tuesday for a migraine. Pt states she is not pregnant at this time.

## 2013-03-05 LAB — URINE CULTURE

## 2013-04-01 ENCOUNTER — Telehealth: Payer: Self-pay | Admitting: Oncology

## 2013-04-01 NOTE — Telephone Encounter (Signed)
S/W PT IN RE TO NP APPT 09/09 @ 10:30 W/DR. SHADAD REFERRINGDR. NAIMA DILLARD DX- ANEMIA WELCOME PACKET MAILED.

## 2013-04-01 NOTE — Telephone Encounter (Signed)
C/D 04/01/13 for appt. 04/22/13 °

## 2013-04-18 ENCOUNTER — Other Ambulatory Visit: Payer: Self-pay | Admitting: Oncology

## 2013-04-18 DIAGNOSIS — D649 Anemia, unspecified: Secondary | ICD-10-CM

## 2013-04-22 ENCOUNTER — Telehealth: Payer: Self-pay | Admitting: *Deleted

## 2013-04-22 ENCOUNTER — Other Ambulatory Visit (HOSPITAL_BASED_OUTPATIENT_CLINIC_OR_DEPARTMENT_OTHER): Payer: Medicaid Other | Admitting: Lab

## 2013-04-22 ENCOUNTER — Encounter: Payer: Self-pay | Admitting: Oncology

## 2013-04-22 ENCOUNTER — Ambulatory Visit: Payer: Medicaid Other

## 2013-04-22 ENCOUNTER — Telehealth: Payer: Self-pay | Admitting: Oncology

## 2013-04-22 ENCOUNTER — Ambulatory Visit (HOSPITAL_BASED_OUTPATIENT_CLINIC_OR_DEPARTMENT_OTHER): Payer: Medicaid Other | Admitting: Oncology

## 2013-04-22 VITALS — BP 112/65 | HR 76 | Temp 98.8°F | Resp 20 | Ht 64.0 in | Wt 157.3 lb

## 2013-04-22 DIAGNOSIS — N920 Excessive and frequent menstruation with regular cycle: Secondary | ICD-10-CM

## 2013-04-22 DIAGNOSIS — D509 Iron deficiency anemia, unspecified: Secondary | ICD-10-CM

## 2013-04-22 DIAGNOSIS — D573 Sickle-cell trait: Secondary | ICD-10-CM

## 2013-04-22 DIAGNOSIS — D649 Anemia, unspecified: Secondary | ICD-10-CM

## 2013-04-22 LAB — COMPREHENSIVE METABOLIC PANEL (CC13)
AST: 17 U/L (ref 5–34)
Albumin: 3.7 g/dL (ref 3.5–5.0)
BUN: 6.4 mg/dL — ABNORMAL LOW (ref 7.0–26.0)
Calcium: 9 mg/dL (ref 8.4–10.4)
Chloride: 107 mEq/L (ref 98–109)
Glucose: 96 mg/dl (ref 70–140)
Potassium: 3.7 mEq/L (ref 3.5–5.1)
Sodium: 139 mEq/L (ref 136–145)
Total Protein: 7.5 g/dL (ref 6.4–8.3)

## 2013-04-22 LAB — CBC WITH DIFFERENTIAL/PLATELET
Basophils Absolute: 0 10*3/uL (ref 0.0–0.1)
Eosinophils Absolute: 0.5 10*3/uL (ref 0.0–0.5)
HCT: 29.2 % — ABNORMAL LOW (ref 34.8–46.6)
HGB: 8.9 g/dL — ABNORMAL LOW (ref 11.6–15.9)
MONO#: 0.5 10*3/uL (ref 0.1–0.9)
NEUT#: 4 10*3/uL (ref 1.5–6.5)
NEUT%: 61.8 % (ref 38.4–76.8)
RDW: 17.9 % — ABNORMAL HIGH (ref 11.2–14.5)
WBC: 6.5 10*3/uL (ref 3.9–10.3)
lymph#: 1.5 10*3/uL (ref 0.9–3.3)

## 2013-04-22 LAB — IRON AND TIBC CHCC
TIBC: 413 ug/dL (ref 236–444)
UIBC: 397 ug/dL — ABNORMAL HIGH (ref 120–384)

## 2013-04-22 NOTE — Telephone Encounter (Signed)
Per staff message and POF I have scheduled appts.  JMW  

## 2013-04-22 NOTE — Progress Notes (Signed)
Checked in new pt with no financial concerns. °

## 2013-04-22 NOTE — Progress Notes (Signed)
Hematology and Oncology Follow Up Visit  [name] [id number] 03-16-1986 27 y.o. 04/22/2013 11:37 AM PRATT,TANYA S, MDDillard, Naima A, MD   Principle Diagnosis: 27 year old woman with iron deficiency anemia as well as sickle cell trait diagnosed in 2012.   Prior Therapy: Oral iron replacement intermittently and she has not been taking it regularly.  Interim History: 27 year old woman I am evaluating for iron deficiency anemia as well as history of sickle cell trait. I initially saw her back in 2012 during her last pregnancy and her hemoglobin was 8 with a ferritin of 9. We have discussed IV iron at that time and elected to proceed with by mouth iron. She took it intermittently and gave birth without any incident. Over the last year and a half. She have been erratic in her iron intake and she was referred to me back for reevaluation. Clinically she has little symptoms. She does report chalk and ice cravings. She also reported some occasional headaches and fatigue. She has reported some hematochezia but predominantly due to constipation from time. She does report heavy menstrual bleeding lasting about 7 days. Medications: I have reviewed the patient's current medications.  Current Outpatient Prescriptions  Medication Sig Dispense Refill  . Aspirin-Acetaminophen-Caffeine (EXCEDRIN PO) Take 1 tablet by mouth daily as needed (heachache).      Marland Kitchen HYDROcodone-acetaminophen (NORCO/VICODIN) 5-325 MG per tablet Take 1 tablet by mouth every 6 (six) hours as needed for pain.  30 tablet  0  . Loratadine (CLARITIN PO) Take 1 tablet by mouth daily as needed.       No current facility-administered medications for this visit.     Allergies:  Allergies  Allergen Reactions  . Sulfa Antibiotics Hives and Itching    Past Medical History, Surgical history, Social history, and Family History were reviewed and updated.  Review of Systems:  Remaining ROS negative. Physical Exam: Blood pressure 112/65,  pulse 76, temperature 98.8 F (37.1 C), temperature source Oral, resp. rate 20, height 5\' 4"  (1.626 m), weight 157 lb 4.8 oz (71.351 kg), SpO2 100.00%. ECOG: 0 General appearance: alert Head: Normocephalic, without obvious abnormality, atraumatic Neck: no adenopathy, no carotid bruit, no JVD, supple, symmetrical, trachea midline and thyroid not enlarged, symmetric, no tenderness/mass/nodules Lymph nodes: Cervical, supraclavicular, and axillary nodes normal. Heart:regular rate and rhythm, S1, S2 normal, no murmur, click, rub or gallop Lung:chest clear, no wheezing, rales, normal symmetric air entry Abdomin: soft, non-tender, without masses or organomegaly EXT:no erythema, induration, or nodules   Lab Results: Lab Results  Component Value Date   WBC 6.5 04/22/2013   HGB 8.9* 04/22/2013   HCT 29.2* 04/22/2013   MCV 66.1* 04/22/2013   PLT 221 04/22/2013     Chemistry      Component Value Date/Time   NA 134* 03/02/2013 1459   K 2.9* 03/02/2013 1459   CL 97 03/02/2013 1459   CO2 27 03/02/2013 1459   BUN 4* 03/02/2013 1459   CREATININE 0.77 03/02/2013 1459      Component Value Date/Time   CALCIUM 8.6 03/02/2013 1459   ALKPHOS 55 03/02/2013 1459   AST 15 03/02/2013 1459   ALT 9 03/02/2013 1459   BILITOT 0.5 03/02/2013 1459      Impression and Plan:  27 year old woman with the following issues:  1. Iron deficiency anemia as evidenced by a hemoglobin of 8.9 and microcytosis with an MCV of 66.1. Treatment options were discussed again with Ms. Turkington  include continuation of oral iron versus Feraheme IV infusion. Risks  and benefits of IV iron were discussed today in detail including infusion-related toxicities, arthralgias and myalgias. He is agreeable to proceed and we'll set that up for her next week. She feels that is a a better option for her given the fact that she have been noncompliant with oral iron as a cause her to be constipated and cause her to have hemorrhoids and bleeding.  2. History of  sickle cell trait and should it is contributing to her microcytosis but is predominantly iron deficiency.  3. 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 Her platelet counts are perfectly normal.  4. Followup: I will see her back in 6 months for reevaluation remeasure her iron stores.  [facility], MD 9/9/201411:37 AM

## 2013-04-22 NOTE — Telephone Encounter (Signed)
gave pt appt for infusion next week then MD and lan visit on March 2015

## 2013-04-29 ENCOUNTER — Ambulatory Visit (HOSPITAL_BASED_OUTPATIENT_CLINIC_OR_DEPARTMENT_OTHER): Payer: Medicaid Other

## 2013-04-29 VITALS — BP 112/60 | HR 87 | Temp 99.1°F | Resp 20

## 2013-04-29 DIAGNOSIS — D509 Iron deficiency anemia, unspecified: Secondary | ICD-10-CM

## 2013-04-29 DIAGNOSIS — D649 Anemia, unspecified: Secondary | ICD-10-CM

## 2013-04-29 MED ORDER — SODIUM CHLORIDE 0.9 % IV SOLN
Freq: Once | INTRAVENOUS | Status: AC
  Administered 2013-04-29: 13:00:00 via INTRAVENOUS

## 2013-04-29 MED ORDER — FERUMOXYTOL INJECTION 510 MG/17 ML
1020.0000 mg | Freq: Once | INTRAVENOUS | Status: AC
  Administered 2013-04-29: 1020 mg via INTRAVENOUS
  Filled 2013-04-29: qty 34

## 2013-04-29 NOTE — Patient Instructions (Addendum)
Ferumoxytol injection What is this medicine? FERUMOXYTOL is an iron complex. Iron is used to make healthy red blood cells, which carry oxygen and nutrients throughout the body. This medicine is used to treat iron deficiency anemia in people with chronic kidney disease. This medicine may be used for other purposes; ask your health care provider or pharmacist if you have questions. What should I tell my health care provider before I take this medicine? They need to know if you have any of these conditions: -anemia not caused by low iron levels -high levels of iron in the blood -magnetic resonance imaging (MRI) test scheduled -an unusual or allergic reaction to iron, other medicines, foods, dyes, or preservatives -pregnant or trying to get pregnant -breast-feeding How should I use this medicine? This medicine is for infusion into a vein. It is given by a health care professional in a hospital or clinic setting. Talk to your pediatrician regarding the use of this medicine in children. Special care may be needed. Overdosage: If you think you've taken too much of this medicine contact a poison control center or emergency room at once. Overdosage: If you think you have taken too much of this medicine contact a poison control center or emergency room at once. NOTE: This medicine is only for you. Do not share this medicine with others. What if I miss a dose? It is important not to miss your dose. Call your doctor or health care professional if you are unable to keep an appointment. What may interact with this medicine? This medicine may interact with the following medications: -other iron products This list may not describe all possible interactions. Give your health care provider a list of all the medicines, herbs, non-prescription drugs, or dietary supplements you use. Also tell them if you smoke, drink alcohol, or use illegal drugs. Some items may interact with your medicine. What should I watch  for while using this medicine? Visit your doctor or healthcare professional regularly. Tell your doctor or healthcare professional if your symptoms do not start to get better or if they get worse. You may need blood work done while you are taking this medicine. You may need to follow a special diet. Talk to your doctor. Foods that contain iron include: whole grains/cereals, dried fruits, beans, or peas, leafy green vegetables, and organ meats (liver, kidney). What side effects may I notice from receiving this medicine? Side effects that you should report to your doctor or health care professional as soon as possible: -allergic reactions like skin rash, itching or hives, swelling of the face, lips, or tongue -breathing problems -changes in blood pressure -feeling faint or lightheaded, falls -fever or chills -flushing, sweating, or hot feelings -swelling of the ankles or feet Side effects that usually do not require medical attention (Report these to your doctor or health care professional if they continue or are bothersome.): -diarrhea -headache -nausea, vomiting -stomach pain This list may not describe all possible side effects. Call your doctor for medical advice about side effects. You may report side effects to FDA at 1-800-FDA-1088. Where should I keep my medicine? This drug is given in a hospital or clinic and will not be stored at home. NOTE: This sheet is a summary. It may not cover all possible information. If you have questions about this medicine, talk to your doctor, pharmacist, or health care provider.  2012, Elsevier/Gold Standard. (04/22/2008 9:48:25 PM) 

## 2013-09-03 ENCOUNTER — Emergency Department (HOSPITAL_COMMUNITY)
Admission: EM | Admit: 2013-09-03 | Discharge: 2013-09-03 | Disposition: A | Discharge status: Home / Self Care | Payer: Medicaid Other | Attending: Emergency Medicine | Admitting: Emergency Medicine

## 2013-09-03 ENCOUNTER — Encounter (HOSPITAL_COMMUNITY): Payer: Self-pay | Admitting: Emergency Medicine

## 2013-09-03 ENCOUNTER — Emergency Department (HOSPITAL_COMMUNITY): Payer: Medicaid Other

## 2013-09-03 DIAGNOSIS — J111 Influenza due to unidentified influenza virus with other respiratory manifestations: Secondary | ICD-10-CM | POA: Insufficient documentation

## 2013-09-03 DIAGNOSIS — Z87440 Personal history of urinary (tract) infections: Secondary | ICD-10-CM | POA: Insufficient documentation

## 2013-09-03 DIAGNOSIS — J45901 Unspecified asthma with (acute) exacerbation: Secondary | ICD-10-CM | POA: Insufficient documentation

## 2013-09-03 DIAGNOSIS — Z862 Personal history of diseases of the blood and blood-forming organs and certain disorders involving the immune mechanism: Secondary | ICD-10-CM | POA: Insufficient documentation

## 2013-09-03 DIAGNOSIS — Z8742 Personal history of other diseases of the female genital tract: Secondary | ICD-10-CM | POA: Insufficient documentation

## 2013-09-03 DIAGNOSIS — Z8619 Personal history of other infectious and parasitic diseases: Secondary | ICD-10-CM | POA: Insufficient documentation

## 2013-09-03 MED ORDER — ACETAMINOPHEN 325 MG PO TABS
650.0000 mg | ORAL_TABLET | Freq: Four times a day (QID) | ORAL | Status: DC | PRN
  Administered 2013-09-03: 650 mg via ORAL
  Filled 2013-09-03: qty 2

## 2013-09-03 MED ORDER — ALBUTEROL SULFATE (2.5 MG/3ML) 0.083% IN NEBU
5.0000 mg | INHALATION_SOLUTION | Freq: Once | RESPIRATORY_TRACT | Status: AC
  Administered 2013-09-03: 5 mg via RESPIRATORY_TRACT
  Filled 2013-09-03: qty 6

## 2013-09-03 MED ORDER — IPRATROPIUM BROMIDE 0.02 % IN SOLN
0.5000 mg | Freq: Once | RESPIRATORY_TRACT | Status: AC
  Administered 2013-09-03: 0.5 mg via RESPIRATORY_TRACT
  Filled 2013-09-03: qty 2.5

## 2013-09-03 NOTE — ED Notes (Signed)
Pt states she began feeling bad on Sunday.  Pt c/o fever, non-productive cough, and body aches.  Pt has been taking OTC meds with little relief.  Pt states it hurts when she breaths.  Her child's pediatrician stated she may have pneumonia.  Pt temp in triage 100.5.

## 2013-09-03 NOTE — ED Provider Notes (Signed)
CSN: [id number]     Arrival date & time 09/03/13  1308 History  This chart was scribed for non-physician practitioner Santiago Glad, PA-C working with Celene Kras, MD by Dorothey Baseman, ED Scribe. This patient was seen in room TR09C/TR09C and the patient's care was started at 2:35 PM.    Chief Complaint  Patient presents with  . Fever  . Cough  . Generalized Body Aches   The history is provided by the patient. No language interpreter was used.   HPI Comments: [name] is a 28 y.o. Female with a history of sickle cell trait and asthma who presents to the Emergency Department complaining of fever (102 measured at home, 100.5 measured in the ED) with associated dry cough, diffuse headache, and diffuse myalgias onset 4 days ago. She reports some associated shortness of breath and wheezing. She reports some associated nausea with 1 episode of emesis 2 days ago and diarrhea 4 days ago. She reports taking Alka Seltzer at home without relief. Patient reports that her daughter has been sick recently and her pediatrician expressed concern for pneumonia. She denies sore throat. She did not get the Influenza vaccine this year.  Past Medical History  Diagnosis Date  . Bacterial vaginosis   . Yeast vaginitis   . UTI (urinary tract infection)   . Asthma   . Thrombocytopenia   . Sickle cell disease   . Preterm labor   . Sickle cell trait   . Abnormal Pap smear   . Chlamydia   . HPV (human papilloma virus) infection    Past Surgical History  Procedure Laterality Date  . Cesarean section    . Eye surgery    . Eye surgery    . Cesarean section  08/28/2011    Procedure: CESAREAN SECTION;  Surgeon: Esmeralda Arthur, MD;  Location: [facility];  Service: Gynecology;  Laterality: N/A;   Family History  Problem Relation Age of Onset  . Cancer Paternal Uncle   . Cancer Maternal Grandmother   . Cancer Paternal Grandfather   . Anesthesia problems Neg Hx    History  Substance Use Topics  . Smoking  status: Never Smoker   . Smokeless tobacco: Never Used  . Alcohol Use: No     Comment: none with preg   OB History   Grav Para Term Preterm Abortions TAB SAB Ect Mult Living   5 2 1 1 3 1 2   2      Review of Systems  A complete 10 system review of systems was obtained and all systems are negative except as noted in the HPI and PMH.   Allergies  Sulfa antibiotics  Home Medications   Current Outpatient Rx  Name  Route  Sig  Dispense  Refill  . Aspirin-Acetaminophen-Caffeine (EXCEDRIN PO)   Oral   Take 1 tablet by mouth daily as needed (heachache).         Marland Kitchen HYDROcodone-acetaminophen (NORCO/VICODIN) 5-325 MG per tablet   Oral   Take 1 tablet by mouth every 6 (six) hours as needed for pain.   30 tablet   0   . Loratadine (CLARITIN PO)   Oral   Take 1 tablet by mouth daily as needed.          Triage Vitals: BP 113/67  Pulse 116  Temp(Src) 100.5 F (38.1 C) (Oral)  Resp 16  SpO2 97%  Physical Exam  Nursing note and vitals reviewed. Constitutional: She appears well-developed and well-nourished.  HENT:  Head: Normocephalic and atraumatic.  Right Ear: Hearing, tympanic membrane, external ear and ear canal normal.  Left Ear: Hearing, tympanic membrane, external ear and ear canal normal.  Mouth/Throat: Oropharynx is clear and moist.  Eyes: EOM are normal. Pupils are equal, round, and reactive to light.  Neck: Normal range of motion. Neck supple.  Cardiovascular: Normal rate, regular rhythm and normal heart sounds.   Pulmonary/Chest: Effort normal and breath sounds normal. She has no wheezes.  Abdominal: Soft. Bowel sounds are normal. She exhibits no distension. There is no tenderness.  Musculoskeletal: Normal range of motion.  Neurological: She is alert.  Skin: Skin is warm and dry.  Psychiatric: She has a normal mood and affect. Her behavior is normal.    ED Course  Procedures (including critical care time)  DIAGNOSTIC STUDIES: Oxygen Saturation is 97% on  room air, normal by my interpretation.    COORDINATION OF CARE: 2:40 PM- Discussed that chest x-ray results were normal and negative for pneumonia. Discussed that symptoms are likely due to the flu, but that Tamiflu will not be effective since her symptoms presented more than 48 hours ago. Advised patient to stay well-hydrated and rest. Will order a breathing treatment as per the patient's request. Discussed treatment plan with patient at bedside and patient verbalized agreement.     Labs Review Labs Reviewed - No data to display Imaging Review Dg Chest 2 View  09/03/2013   CLINICAL DATA:  Fever.  EXAM: CHEST  2 VIEW  COMPARISON:  PA and lateral chest 03/02/2013.  FINDINGS: The lungs are clear. Heart size is normal. No pneumothorax or pleural effusion. No focal bony abnormality.  IMPRESSION: No acute disease.   Electronically Signed   By: Drusilla Kannerhomas  Dalessio M.D.   On: 09/03/2013 14:34    EKG Interpretation   None       MDM  No diagnosis found. Patient with symptoms consistent with influenza.  No signs of dehydration, tolerating PO's.  Lungs are clear. CXR negative.  Discussed the cost versus benefit of Tamiflu treatment with the patient.  The patient understands that symptoms are greater than the recommended 24-48 hour window of treatment.  Patient will be discharged with instructions to orally hydrate, rest, and use over-the-counter medications such as anti-inflammatories ibuprofen and Aleve for muscle aches and Tylenol for fever.  Patient stable for discharge.  Return precautions given.  I personally performed the services described in this documentation, which was scribed in my presence. The recorded information has been reviewed and is accurate.    Santiago GladHeather Tanairi Cypert, PA-C 09/03/13 1606

## 2013-09-03 NOTE — Discharge Instructions (Signed)
Take medications as prescribed. Followup with your doctor in regards to your hospital visit. If you do not have a doctor use the resource guide listed below to help he find one. You may return to the emergency department if symptoms worsen, become progressive, or become more concerning. Read below to learn more about your diagnosis & reasons to return. Use Tylenol to treat your fever  ° °Influenza, Adult  °Influenza (flu)  starts suddenly, usually with a fever. It causes chills, dry and hacking cough, headache, body aches, and sore throat. Influenza spreads easily from one person to another.  °HOME CARE  °Only take medicines as told by your doctor.  °Rest.  °Drink enough fluids to keep your pee (urine) clear or pale yellow.  °Wash your hands often. Do this after you blow your nose, after you go to the bathroom, and before you touch food.  °GET HELP RIGHT AWAY IF:  °You have shortness of breath while resting.  °You have pain or pressure in the chest or belly (abdomen).  °You suddenly feel dizzy.  °You feel confused.  °You have a hard time breathing.  °Your skin or nails turn bluish in color.  °You get a bad neck pain or stiffness.  °You get a bad headache, face pain, or earache.  °You throw up (vomit) a lot and often.  °You have a fever > 101 that persists  °MAKE SURE YOU:  °Understand these instructions.  °Will watch your condition.  °Will get help right away if you are not doing well or get worse.  ° ° °RESOURCE GUIDE ° °Dental Problems ° °Patients with Medicaid: °[facility]                     [facility] °[address]                                           [address] °Phone:  [phone]                                                  Phone:  510-2600 ° °If unable to pay or uninsured, contact:  [facility] or [facility] to become qualified for the adult dental clinic. ° °Chronic Pain Problems °Contact [facility]  297-2271 °Patients  need to be referred by their primary care doctor. ° °Insufficient Money for Medicine °Contact [employer]:  call "211" or [facility] 271-5999. ° °No Primary Care Doctor °Call [facility]  832-8000 °Other agencies that provide inexpensive medical care °   [facility]  832-8035 °   [facility]  832-7272 °   [facility]  271-5999 °   [facility]  832-4777 °   [facility]  373-0678 °   [facility]  272-1050 ° °Psychological Services °[facility]  [phone] °[facility]  [phone] °[facility]   800 853-5163 (emergency services 641-4993) ° °Substance Abuse Resources °[facility]  336-882-2125 °[facility] 336-784-9470 °[facility] 336-285-9073 °[facility] 336-845-3988 °Residential & Outpatient Substance Abuse Program  800-659-3381 ° °Abuse/Neglect °[place] [facility] (336) 641-3795 °[place]   Child Abuse Hotline 800-378-5315 (After Hours) ° °Emergency Shelter °[facility] (336) 271-5985 ° °Maternity Homes °[facility] (336) 275-9566 °[facility] (704) 372-4663 ° °MRSA Hotline #:   832-7006 ° ° ° °[place] [facility] ° °[facility] [place]     [employer]                          [facility] °[address] [place]                       [address]      [address]  °[place]                                                [place]                            [place] °Phone:  349-3220                                   Phone:  [phone]                 Phone:  342-8140 ° °[facility] °Phone:  342-8316 ° °[place] Child Abuse Hotline °(336) 342-1394 °(336) 342-3537 (After Hours) ° ° °

## 2013-09-09 NOTE — ED Provider Notes (Signed)
Medical screening examination/treatment/procedure(s) were performed by non-physician practitioner and as supervising physician I was immediately available for consultation/collaboration.    Momoko Slezak R Dabney Schanz, MD 09/09/13 0702 

## 2013-10-21 ENCOUNTER — Other Ambulatory Visit: Payer: Medicaid Other

## 2013-10-21 ENCOUNTER — Ambulatory Visit: Payer: Medicaid Other | Admitting: Oncology

## 2013-10-27 ENCOUNTER — Inpatient Hospital Stay (HOSPITAL_COMMUNITY)
Admission: AD | Admit: 2013-10-27 | Discharge: 2013-10-27 | Disposition: A | Discharge status: Home / Self Care | Payer: Medicaid Other | Source: Ambulatory Visit | Attending: Obstetrics and Gynecology | Admitting: Obstetrics and Gynecology

## 2013-10-27 ENCOUNTER — Inpatient Hospital Stay (HOSPITAL_COMMUNITY): Payer: Medicaid Other

## 2013-10-27 ENCOUNTER — Encounter (HOSPITAL_COMMUNITY): Payer: Self-pay | Admitting: *Deleted

## 2013-10-27 DIAGNOSIS — O209 Hemorrhage in early pregnancy, unspecified: Secondary | ICD-10-CM

## 2013-10-27 DIAGNOSIS — O208 Other hemorrhage in early pregnancy: Secondary | ICD-10-CM | POA: Insufficient documentation

## 2013-10-27 DIAGNOSIS — R109 Unspecified abdominal pain: Secondary | ICD-10-CM | POA: Insufficient documentation

## 2013-10-27 DIAGNOSIS — O418X10 Other specified disorders of amniotic fluid and membranes, first trimester, not applicable or unspecified: Secondary | ICD-10-CM

## 2013-10-27 DIAGNOSIS — O468X1 Other antepartum hemorrhage, first trimester: Secondary | ICD-10-CM

## 2013-10-27 LAB — URINALYSIS, ROUTINE W REFLEX MICROSCOPIC
BILIRUBIN URINE: NEGATIVE
Glucose, UA: NEGATIVE mg/dL
Ketones, ur: NEGATIVE mg/dL
NITRITE: NEGATIVE
PH: 6 (ref 5.0–8.0)
Protein, ur: NEGATIVE mg/dL
SPECIFIC GRAVITY, URINE: 1.025 (ref 1.005–1.030)
Urobilinogen, UA: 0.2 mg/dL (ref 0.0–1.0)

## 2013-10-27 LAB — URINE MICROSCOPIC-ADD ON

## 2013-10-27 LAB — POCT PREGNANCY, URINE: Preg Test, Ur: POSITIVE — AB

## 2013-10-27 LAB — WET PREP, GENITAL: Trich, Wet Prep: NONE SEEN

## 2013-10-27 NOTE — Discharge Instructions (Signed)
Subchorionic Hematoma °A subchorionic hematoma is a gathering of blood between the outer wall of the placenta and the inner wall of the womb (uterus). The placenta is the organ that connects the fetus to the wall of the uterus. The placenta performs the feeding, breathing (oxygen to the fetus), and waste removal (excretory work) of the fetus.  °Subchorionic hematoma is the most common abnormality found on a result from ultrasonography done during the first trimester or early second trimester of pregnancy. If there has been little or no vaginal bleeding, early small hematomas usually shrink on their own and do not affect your baby or pregnancy. The blood is gradually absorbed over 1 2 weeks. When bleeding starts later in pregnancy or the hematoma is larger or occurs in an older pregnant woman, the outcome may not be as good. Larger hematomas may get bigger, which increases the chances for miscarriage. Subchorionic hematoma also increases the risk of premature detachment of the placenta from the uterus, preterm (premature) labor, and stillbirth. °HOME CARE INSTRUCTIONS  °· Stay on bed rest if your health care provider recommends this. Although bed rest will not prevent more bleeding or prevent a miscarriage, your health care provider may recommend bed rest until you are advised otherwise. °· Avoid heavy lifting (more than 10 lb [4.5 kg]), exercise, sexual intercourse, or douching as directed by your health care provider. °· Keep track of the number of pads you use each day and how soaked (saturated) they are. Write down this information. °· Do not use tampons. °· Keep all follow-up appointments as directed by your health care provider. Your health care provider may ask you to have follow-up blood tests or ultrasound tests or both. °SEEK IMMEDIATE MEDICAL CARE IF:  °· You have severe cramps in your stomach, back, abdomen, or pelvis. °· You have a fever. °· You pass large clots or tissue. Save any tissue for your health  care provider to look at. °· Your bleeding increases or you become lightheaded, feel weak, or have fainting episodes. °Document Released: 11/15/2006 Document Revised: 05/21/2013 Document Reviewed: 02/27/2013 °ExitCare® Patient Information ©2014 [employer]. ° °Pelvic Rest °Pelvic rest is sometimes recommended for women when:  °· The placenta is partially or completely covering the opening of the cervix (placenta previa). °· There is bleeding between the uterine wall and the amniotic sac in the first trimester (subchorionic hemorrhage). °· The cervix begins to open without labor starting (incompetent cervix, cervical insufficiency). °· The labor is too early (preterm labor). °HOME CARE INSTRUCTIONS °· Do not have sexual intercourse, stimulation, or an orgasm. °· Do not use tampons, douche, or put anything in the vagina. °· Do not lift anything over 10 pounds (4.5 kg). °· Avoid strenuous activity or straining your pelvic muscles. °SEEK MEDICAL CARE IF:  °· You have any vaginal bleeding during pregnancy. Treat this as a potential emergency. °· You have cramping pain felt low in the stomach (stronger than menstrual cramps). °· You notice vaginal discharge (watery, mucus, or bloody). °· You have a low, dull backache. °· There are regular contractions or uterine tightening. °SEEK IMMEDIATE MEDICAL CARE IF: °You have vaginal bleeding and have placenta previa.  °Document Released: 11/25/2010 Document Revised: 10/23/2011 Document Reviewed: 11/25/2010 °ExitCare® Patient Information ©2014 [employer]. ° °

## 2013-10-27 NOTE — MAU Provider Note (Signed)
CSN: [id number]     Arrival date & time 10/27/13  1610 History   None    Chief Complaint  Patient presents with  . Possible Pregnancy  . Vaginal Bleeding     (Consider location/radiation/quality/duration/timing/severity/associated sxs/prior Treatment) Patient is a 28 y.o. female presenting with vaginal bleeding. The history is provided by the patient.  Vaginal Bleeding This is a new problem. The current episode started today. The problem occurs intermittently. The problem has been unchanged. Associated symptoms include nausea. Pertinent negatives include no anorexia, chest pain, chills, coughing, fever, headaches, rash, sore throat or vomiting. Abdominal pain: occasional cramp. Nothing aggravates the symptoms. She has tried nothing for the symptoms.   HARMONEE TOZER is a [id number] @ [redacted]w[redacted]d gestation who presents to the ED with vaginal bleeding that started approximately 2 hours ago. She describes the bleeding as lighter than a period. The bleeding is dark red. Last sexual intercourse was earlier today. Last pap smear 2 years ago was normal. Hx of HPV and Chlamydia. Current sex partner x 6 months.   Past Medical History  Diagnosis Date  . Bacterial vaginosis   . Yeast vaginitis   . UTI (urinary tract infection)   . Asthma   . Thrombocytopenia   . Sickle cell disease   . Preterm labor   . Sickle cell trait   . Abnormal Pap smear   . Chlamydia   . HPV (human papilloma virus) infection    Past Surgical History  Procedure Laterality Date  . Cesarean section    . Eye surgery    . Eye surgery    . Cesarean section  08/28/2011    Procedure: CESAREAN SECTION;  Surgeon: Esmeralda Arthur, MD;  Location: [facility];  Service: Gynecology;  Laterality: N/A;   Family History  Problem Relation Age of Onset  . Cancer Paternal Uncle   . Cancer Maternal Grandmother   . Cancer Paternal Grandfather   . Anesthesia problems Neg Hx    History  Substance Use Topics  . Smoking status: Never Smoker    . Smokeless tobacco: Never Used  . Alcohol Use: No     Comment: none with preg   OB History   Grav Para Term Preterm Abortions TAB SAB Ect Mult Living   6 2 1 1 3 1 2   2      Review of Systems  Constitutional: Negative for fever and chills.  HENT: Negative.  Negative for sore throat.   Eyes: Negative for visual disturbance.  Respiratory: Negative for cough and shortness of breath.   Cardiovascular: Negative for chest pain.  Gastrointestinal: Positive for nausea. Negative for vomiting and anorexia. Abdominal pain: occasional cramp.  Genitourinary: Positive for vaginal bleeding. Negative for dysuria, urgency and frequency.  Musculoskeletal: Positive for back pain.  Skin: Negative for rash.  Neurological: Negative for syncope, light-headedness and headaches.  Psychiatric/Behavioral: Negative for confusion. Nervous/anxious: hx of anxiety and depression, no medications currently.       Allergies  Sulfa antibiotics  Home Medications  No current outpatient prescriptions on file. BP 125/70  Pulse 94  Temp(Src) 98.3 F (36.8 C)  Resp 18  Ht 5' 4.5" (1.638 m)  Wt 168 lb 3.2 oz (76.295 kg)  BMI 28.44 kg/m2  SpO2 100%  LMP 08/12/2013 Physical Exam  Nursing note and vitals reviewed. Constitutional: She is oriented to person, place, and time. She appears well-developed and well-nourished.  HENT:  Head: Normocephalic and atraumatic.  Eyes: EOM are normal.  Neck: Neck supple.  Cardiovascular: Normal rate.   Pulmonary/Chest: Effort normal.  Abdominal: Soft. There is no tenderness.  Genitourinary:  External genitalia without lesions, blood tinged mucous discharge vaginal vault. Cervix long, closed, no CMT, no adnexal tenderness. Uterus 10 week size.   Musculoskeletal: Normal range of motion.  Neurological: She is alert and oriented to person, place, and time. No cranial nerve deficit.  Skin: Skin is warm and dry.  Psychiatric: She has a normal mood and affect. Her behavior is  normal.   [place] Ob Comp Less 14 Wks  10/27/2013   CLINICAL DATA:  Bleeding in early pregnancy.  EXAM: OBSTETRIC <14 WK ULTRASOUND  TECHNIQUE: Transabdominal ultrasound was performed for evaluation of the gestation as well as the maternal uterus and adnexal regions.  COMPARISON:  None.  FINDINGS: Intrauterine gestational sac: Visualized/normal in shape.  Yolk sac:  Not visualized  Embryo:  Visualize  Cardiac Activity: Visualize  Heart Rate: 124 bpm  CRL:   44  mm   11 w 2 d                  US EDC: 05/16/2014  Maternal uterus/adnexae: On some images including image 48, a rounded 3.5 x 4.4 cm "lesion" beneath the placenta is heterogeneous and vascular. Blood flow is shown in this vicinity on image 26. Also on image 26 there is a suggestion of a trace amount of subchorionic hemorrhage along the inferior placental margin.  IMPRESSION: 1. Single living intrauterine pregnancy measuring 11 weeks, 2 days gestation. 2. Suspected small amount of subchorionic hemorrhage along the inferior placental margin. 3. Vascular structure deep to the placenta was thought at real-time ultrasound by the technologist to represent a contraction, although delayed imaging to ensure resolution of this contraction and rule out entities such as chorioangioma was not performed. This merits attention on followup sonography. Given the internal vascularity, I do not think that this represents a placental abruption.   Electronically Signed   By: Herbie BaltimoreWalt  Liebkemann M.D.   On: 10/27/2013 18:09    ED Course  Procedures   MDM  28 y.o. female with post coital bleeding and subchorionic hemorrhage. I have reviewed this patient's vital signs, nurses notes, appropriate labs and imaging.  I have discussed findings with the patient and plan of care. She voices understanding. She will start her prenatal care and return as needed for problems. Stable for discharge without signs of miscarriage at this time.

## 2013-10-27 NOTE — MAU Note (Signed)
Patient states she has had a positive pregnancy test at [facility] in February. States she has bleeding after intercourse today. No pain.

## 2013-10-28 LAB — GC/CHLAMYDIA PROBE AMP
CT PROBE, AMP APTIMA: NEGATIVE
GC PROBE AMP APTIMA: NEGATIVE

## 2013-10-28 NOTE — MAU Provider Note (Signed)
Attestation of Attending Supervision of Advanced Practitioner (CNM/NP): Evaluation and management procedures were performed by the Advanced Practitioner under my supervision and collaboration.  I have reviewed the Advanced Practitioner's note and chart, and I agree with the management and plan.  Jamicheal Heard 10/28/2013 12:57 PM

## 2013-11-03 ENCOUNTER — Inpatient Hospital Stay (HOSPITAL_COMMUNITY)
Admission: AD | Admit: 2013-11-03 | Discharge: 2013-11-03 | Disposition: A | Discharge status: Home / Self Care | Payer: Medicaid Other | Source: Ambulatory Visit | Attending: Obstetrics & Gynecology | Admitting: Obstetrics & Gynecology

## 2013-11-03 ENCOUNTER — Encounter (HOSPITAL_COMMUNITY): Payer: Self-pay | Admitting: *Deleted

## 2013-11-03 DIAGNOSIS — O99019 Anemia complicating pregnancy, unspecified trimester: Secondary | ICD-10-CM | POA: Insufficient documentation

## 2013-11-03 DIAGNOSIS — O209 Hemorrhage in early pregnancy, unspecified: Secondary | ICD-10-CM

## 2013-11-03 DIAGNOSIS — D571 Sickle-cell disease without crisis: Secondary | ICD-10-CM | POA: Insufficient documentation

## 2013-11-03 DIAGNOSIS — J45909 Unspecified asthma, uncomplicated: Secondary | ICD-10-CM | POA: Insufficient documentation

## 2013-11-03 NOTE — Discharge Instructions (Signed)
° °Pelvic Rest °Pelvic rest is sometimes recommended for women when:  °· The placenta is partially or completely covering the opening of the cervix (placenta previa). °· There is bleeding between the uterine wall and the amniotic sac in the first trimester (subchorionic hemorrhage). °· The cervix begins to open without labor starting (incompetent cervix, cervical insufficiency). °· The labor is too early (preterm labor). °HOME CARE INSTRUCTIONS °· Do not have sexual intercourse, stimulation, or an orgasm. °· Do not use tampons, douche, or put anything in the vagina. °· Do not lift anything over 10 pounds (4.5 kg). °· Avoid strenuous activity or straining your pelvic muscles. °SEEK MEDICAL CARE IF:  °· You have any vaginal bleeding during pregnancy. Treat this as a potential emergency. °· You have cramping pain felt low in the stomach (stronger than menstrual cramps). °· You notice vaginal discharge (watery, mucus, or bloody). °· You have a low, dull backache. °· There are regular contractions or uterine tightening. °SEEK IMMEDIATE MEDICAL CARE IF: °You have vaginal bleeding and have placenta previa.  °Document Released: 11/25/2010 Document Revised: 10/23/2011 Document Reviewed: 11/25/2010 °ExitCare® Patient Information ©2014 [employer]. ° °Vaginal Bleeding During Pregnancy, First Trimester °A small amount of bleeding (spotting) from the vagina is relatively common in early pregnancy. It usually stops on its own. Various things may cause bleeding or spotting in early pregnancy. Some bleeding may be related to the pregnancy, and some may not. In most cases, the bleeding is normal and is not a problem. However, bleeding can also be a sign of something serious. Be sure to tell your health care provider about any vaginal bleeding right away. °Some possible causes of vaginal bleeding during the first trimester include: °· Infection or inflammation of the cervix. °· Growths (polyps) on the cervix. °· Miscarriage or  threatened miscarriage. °· Pregnancy tissue has developed outside of the uterus and in a fallopian tube (tubal pregnancy). °· Tiny cysts have developed in the uterus instead of pregnancy tissue (molar pregnancy). °HOME CARE INSTRUCTIONS  °Watch your condition for any changes. The following actions may help to lessen any discomfort you are feeling: °· Follow your health care provider's instructions for limiting your activity. If your health care provider orders bed rest, you may need to stay in bed and only get up to use the bathroom. However, your health care provider may allow you to continue light activity. °· If needed, make plans for someone to help with your regular activities and responsibilities while you are on bed rest. °· Keep track of the number of pads you use each day, how often you change pads, and how soaked (saturated) they are. Write this down. °· Do not use tampons. Do not douche. °· Do not have sexual intercourse or orgasms until approved by your health care provider. °· If you pass any tissue from your vagina, save the tissue so you can show it to your health care provider. °· Only take over-the-counter or prescription medicines as directed by your health care provider. °· Do not take aspirin because it can make you bleed. °· Keep all follow-up appointments as directed by your health care provider. °SEEK MEDICAL CARE IF: °· You have any vaginal bleeding during any part of your pregnancy. °· You have cramps or labor pains. °SEEK IMMEDIATE MEDICAL CARE IF:  °· You have severe cramps in your back or belly (abdomen). °· You have a fever, not controlled by medicine. °· You pass large clots or tissue from your vagina. °· Your bleeding   increases. °· You feel lightheaded or weak, or you have fainting episodes. °· You have chills. °· You are leaking fluid or have a gush of fluid from your vagina. °· You pass out while having a bowel movement. °MAKE SURE YOU: °· Understand these instructions. °· Will watch  your condition. °· Will get help right away if you are not doing well or get worse. °Document Released: 05/10/2005 Document Revised: 05/21/2013 Document Reviewed: 04/07/2013 °ExitCare® Patient Information ©2014 [employer]. ° °

## 2013-11-03 NOTE — MAU Note (Signed)
[facility] States the bleeding had stopped then started again after intercourse. States bleeding is dark brown/red. No pain.

## 2013-11-03 NOTE — MAU Provider Note (Signed)
History     CSN: [id number]  Arrival date and time: 11/03/13 1317   None     Chief Complaint  Patient presents with  . Vaginal Bleeding   HPI This is a 28 y.o. female at [redacted]w[redacted]d who presents with c/o red/brown bleeding. Denies cramping. See [place] below Just wanted to make sure baby is fine.  RN Note: Patient states she has been diagnosed with a SCH last week. States the bleeding had stopped then started again after intercourse. States bleeding is dark brown/red. No pain.     US done on 3/16: [place] EDC: 05/16/2014 Maternal uterus/adnexae: On some images including image 48, a rounded 3.5 x 4.4 cm "lesion" beneath the placenta is heterogeneous and vascular. Blood flow is shown in this vicinity on image 26. Also on image 26 there is a suggestion of a trace amount of subchorionic hemorrhage along the inferior placental margin. IMPRESSION: 1. Single living intrauterine pregnancy measuring 11 weeks, 2 days gestation. 2. Suspected small amount of subchorionic hemorrhage along the inferior placental margin. 3. Vascular structure deep to the placenta was thought at real-time ultrasound by the technologist to represent a contraction, although delayed imaging to ensure resolution of this contraction and rule out entities such as chorioangioma was not performed. This merits attention on followup sonography. Given the internal vascularity, I do not think that this represents a placental abruption   OB History   Grav Para Term Preterm Abortions TAB SAB Ect Mult Living   6 2 1 1 3 1 2   2       Past Medical History  Diagnosis Date  . Bacterial vaginosis   . Yeast vaginitis   . UTI (urinary tract infection)   . Asthma   . Thrombocytopenia   . Sickle cell disease   . Preterm labor   . Sickle cell trait   . Abnormal Pap smear   . Chlamydia   . HPV (human papilloma virus) infection     Past Surgical History  Procedure Laterality Date  . Cesarean section    . Eye surgery    . Eye surgery    .  Cesarean section  08/28/2011    Procedure: CESAREAN SECTION;  Surgeon: Esmeralda Arthur, MD;  Location: [facility];  Service: Gynecology;  Laterality: N/A;  . Tooth extraction      Family History  Problem Relation Age of Onset  . Cancer Paternal Uncle   . Cancer Maternal Grandmother   . Cancer Paternal Grandfather   . Anesthesia problems Neg Hx     History  Substance Use Topics  . Smoking status: Never Smoker   . Smokeless tobacco: Never Used  . Alcohol Use: No     Comment: none with preg    Allergies:  Allergies  Allergen Reactions  . Sulfa Antibiotics Hives and Itching    Prescriptions prior to admission  Medication Sig Dispense Refill  . Prenatal Vit-Fe Fumarate-FA (PRENATAL MULTIVITAMIN) TABS tablet Take 1 tablet by mouth daily at 12 noon.        Review of Systems  Constitutional: Negative for fever, chills and malaise/fatigue.  Gastrointestinal: Negative for nausea, vomiting, abdominal pain, diarrhea and constipation.  Genitourinary: Negative for dysuria.       Red/brown bleeding, small   Physical Exam   Blood pressure 117/68, pulse 79, temperature 98.4 F (36.9 C), temperature source Oral, resp. rate 16, weight 75.569 kg (166 lb 9.6 oz), last menstrual period 08/12/2013, SpO2 100.00%.  Physical Exam  Constitutional: She is  oriented to person, place, and time. She appears well-developed and well-nourished. No distress.  Cardiovascular: Normal rate.   Respiratory: Effort normal.  GI: Soft.  Genitourinary: Vagina normal and uterus normal. No vaginal discharge (No discharge seen, no tinge of red or brown) found.  Cervix long/closed Uterus nontender  Fetal heart rate 160  Musculoskeletal: Normal range of motion. She exhibits no edema.  Neurological: She is alert and oriented to person, place, and time.  Skin: Skin is warm and dry.  Psychiatric: She has a normal mood and affect.   Fetal heart rate audible at 160 MAU Course  Procedures  Assessment and Plan  A:   SIUP at 8963w6d        Previous bleeding, no evidence of any now       Reassuring fetal heart rate  P:  Discussed findings       [facility]        Plans to go to [facility] when gets Medicaid  [facility]WILLIAMS,MARIE 11/03/2013, 4:20 PM

## 2013-11-06 NOTE — MAU Provider Note (Signed)
Attestation of Attending Supervision of Advanced Practitioner (CNM/NP): Evaluation and management procedures were performed by the Advanced Practitioner under my supervision and collaboration. I have reviewed the Advanced Practitioner's note and chart, and I agree with the management and plan.  Imajean Mcdermid H. 7:43 PM   

## 2013-11-17 LAB — OB RESULTS CONSOLE HEPATITIS B SURFACE ANTIGEN: HEP B S AG: NEGATIVE

## 2013-11-17 LAB — OB RESULTS CONSOLE HIV ANTIBODY (ROUTINE TESTING): HIV: NONREACTIVE

## 2013-11-17 LAB — OB RESULTS CONSOLE RPR: RPR: NONREACTIVE

## 2013-11-17 LAB — OB RESULTS CONSOLE RUBELLA ANTIBODY, IGM: Rubella: IMMUNE

## 2013-11-17 LAB — OB RESULTS CONSOLE ANTIBODY SCREEN: ANTIBODY SCREEN: NEGATIVE

## 2013-11-17 LAB — OB RESULTS CONSOLE ABO/RH: RH TYPE: POSITIVE

## 2014-02-09 ENCOUNTER — Encounter (HOSPITAL_COMMUNITY): Payer: Self-pay | Admitting: *Deleted

## 2014-02-09 ENCOUNTER — Inpatient Hospital Stay (HOSPITAL_COMMUNITY)
Admission: AD | Admit: 2014-02-09 | Discharge: 2014-02-09 | Disposition: A | Discharge status: Home / Self Care | Payer: Medicaid Other | Source: Ambulatory Visit | Attending: Obstetrics and Gynecology | Admitting: Obstetrics and Gynecology

## 2014-02-09 DIAGNOSIS — E869 Volume depletion, unspecified: Secondary | ICD-10-CM | POA: Insufficient documentation

## 2014-02-09 DIAGNOSIS — R209 Unspecified disturbances of skin sensation: Secondary | ICD-10-CM | POA: Insufficient documentation

## 2014-02-09 DIAGNOSIS — O99891 Other specified diseases and conditions complicating pregnancy: Secondary | ICD-10-CM | POA: Insufficient documentation

## 2014-02-09 DIAGNOSIS — R519 Headache, unspecified: Secondary | ICD-10-CM

## 2014-02-09 DIAGNOSIS — R42 Dizziness and giddiness: Secondary | ICD-10-CM | POA: Insufficient documentation

## 2014-02-09 DIAGNOSIS — R51 Headache: Secondary | ICD-10-CM

## 2014-02-09 DIAGNOSIS — O9989 Other specified diseases and conditions complicating pregnancy, childbirth and the puerperium: Principal | ICD-10-CM

## 2014-02-09 HISTORY — DX: Headache: R51

## 2014-02-09 LAB — URINALYSIS, ROUTINE W REFLEX MICROSCOPIC
Bilirubin Urine: NEGATIVE
GLUCOSE, UA: NEGATIVE mg/dL
HGB URINE DIPSTICK: NEGATIVE
Ketones, ur: NEGATIVE mg/dL
Leukocytes, UA: NEGATIVE
Nitrite: NEGATIVE
PROTEIN: NEGATIVE mg/dL
Specific Gravity, Urine: 1.02 (ref 1.005–1.030)
Urobilinogen, UA: 2 mg/dL — ABNORMAL HIGH (ref 0.0–1.0)
pH: 6 (ref 5.0–8.0)

## 2014-02-09 LAB — COMPREHENSIVE METABOLIC PANEL
ALT: 14 U/L (ref 0–35)
AST: 36 U/L (ref 0–37)
Albumin: 3 g/dL — ABNORMAL LOW (ref 3.5–5.2)
Alkaline Phosphatase: 84 U/L (ref 39–117)
BUN: 4 mg/dL — AB (ref 6–23)
CALCIUM: 9.5 mg/dL (ref 8.4–10.5)
CO2: 22 mEq/L (ref 19–32)
CREATININE: 0.6 mg/dL (ref 0.50–1.10)
Chloride: 100 mEq/L (ref 96–112)
GFR calc Af Amer: >90 mL/min (ref >90)
GFR calc non Af Amer: >90 mL/min (ref >90)
Glucose, Bld: 91 mg/dL (ref 70–99)
Potassium: 4.4 mEq/L (ref 3.7–5.3)
Sodium: 135 mEq/L — ABNORMAL LOW (ref 137–147)
Total Bilirubin: 0.4 mg/dL (ref 0.3–1.2)
Total Protein: 6.9 g/dL (ref 6.0–8.3)

## 2014-02-09 LAB — CBC
HEMATOCRIT: 34.1 % — AB (ref 36.0–46.0)
Hemoglobin: 11.3 g/dL — ABNORMAL LOW (ref 12.0–15.0)
MCH: 27 pg (ref 26.0–34.0)
MCHC: 33.1 g/dL (ref 30.0–36.0)
MCV: 81.6 fL (ref 78.0–100.0)
Platelets: 153 10*3/uL (ref 150–400)
RBC: 4.18 MIL/uL (ref 3.87–5.11)
RDW: 14.7 % (ref 11.5–15.5)
WBC: 7.6 10*3/uL (ref 4.0–10.5)

## 2014-02-09 MED ORDER — LACTATED RINGERS IV BOLUS (SEPSIS)
1000.0000 mL | Freq: Once | INTRAVENOUS | Status: AC
  Administered 2014-02-09: 1000 mL via INTRAVENOUS

## 2014-02-09 NOTE — MAU Provider Note (Signed)
History  28 yo [id number] @ 25 6/7 wks arrives to MAU via EMS. States was at work and began having headaches, dizziness and vomiting x 1. Denies LOF, VB or ctxs. Reports active fetus. Reports working two jobs that requires repetitive motion of her upper extremities. Gives h/o bilateral numbness from elbow down into her fingers.   Patient Active Problem List   Diagnosis Date Noted  . Nausea with vomiting 02/09/2014  . Dizziness and giddiness 02/09/2014  . Headache(784.0) 02/09/2014  . Numbness and tingling in left arm 02/09/2014  . Numbness and tingling of right arm 02/09/2014  . Status post repeat low transverse cesarean section 08/31/2011  . Asthma 07/18/2011  . Sickle cell trait 07/18/2011  . ITP (idiopathic thrombocytopenic purpura) 07/18/2011  . Abnormal Pap smear of cervix 07/18/2011  . Anemia 06/09/2011    Chief Complaint  Patient presents with  . Headache  . Dizziness  . Emesis   HPI See above  OB History   Grav Para Term Preterm Abortions TAB SAB Ect Mult Living   _0 Past Medical History  Diagnosis Date  . Bacterial vaginosis   . Yeast vaginitis   . UTI (urinary tract infection)   . Asthma   . Thrombocytopenia   . Sickle cell disease   . Preterm labor   . Sickle cell trait   . Abnormal Pap smear   . Chlamydia   . HPV (human papilloma virus) infection   . LHTDSKAJ(681.1)     Past Surgical History  Procedure Laterality Date  . Cesarean section    . Eye surgery    . Eye surgery    . Cesarean section  08/28/2011    Procedure: CESAREAN SECTION;  Surgeon: Alwyn Pea, MD;  Location: [facility];  Service: Gynecology;  Laterality: N/A;  . Tooth extraction      Family History  Problem Relation Age of Onset  . Cancer Paternal Uncle   . Cancer Maternal Grandmother   . Cancer Paternal Grandfather   . Anesthesia problems Neg Hx     History  Substance Use Topics  . Smoking status: Never Smoker   . Smokeless tobacco: Never Used  . Alcohol  Use: No     Comment: none with preg    Allergies:  Allergies  Allergen Reactions  . Sulfa Antibiotics Hives and Itching    Prescriptions prior to admission  Medication Sig Dispense Refill  . Prenatal Vit-Fe Fumarate-FA (PRENATAL MULTIVITAMIN) TABS tablet Take 1 tablet by mouth daily at 12 noon.        ROS H/A Dizziness Numbness in upper extremities Physical Exam   Blood pressure 112/64, pulse 94, temperature 98.2 F (36.8 C), temperature source Oral, resp. rate 16, height _1  (1.651 m), weight 172 lb 6.4 oz (78.2 kg), last menstrual period 08/12/2013, SpO2 100.00%.  Physical Exam Gen: NAD Neuro: Hand squeeze equal and symmetric, 5/5 Lungs: CTAB CV: RRR Abd: gravid, soft, NT Pelvic: deferred FHR: Cat 1 for this gestational age No ctxs  1 liter bolus given on arrival  Results for orders placed during the hospital encounter of 02/09/14 (from the past 76 hour(s))  URINALYSIS, ROUTINE W REFLEX MICROSCOPIC     Status: Abnormal   Collection Time    02/09/14 11:45 AM      Result Value Ref Range   Color, Urine YELLOW  YELLOW   APPearance CLEAR  CLEAR   Specific Gravity, Urine  1.020  1.005 - 1.030   pH 6.0  5.0 - 8.0   Glucose, UA NEGATIVE  NEGATIVE mg/dL   Hgb urine dipstick NEGATIVE  NEGATIVE   Bilirubin Urine NEGATIVE  NEGATIVE   Ketones, ur NEGATIVE  NEGATIVE mg/dL   Protein, ur NEGATIVE  NEGATIVE mg/dL   Urobilinogen, UA 2.0 (*) 0.0 - 1.0 mg/dL   Nitrite NEGATIVE  NEGATIVE   Leukocytes, UA NEGATIVE  NEGATIVE   Comment: MICROSCOPIC NOT DONE ON URINES WITH NEGATIVE PROTEIN, BLOOD, LEUKOCYTES, NITRITE, OR GLUCOSE <1000 mg/dL.  CBC     Status: Abnormal   Collection Time    02/09/14 12:25 PM      Result Value Ref Range   WBC 7.6  4.0 - 10.5 K/uL   RBC 4.18  3.87 - 5.11 MIL/uL   Hemoglobin 11.3 (*) 12.0 - 15.0 g/dL   HCT 34.1 (*) 36.0 - 46.0 %   MCV 81.6  78.0 - 100.0 fL   MCH 27.0  26.0 - 34.0 pg   MCHC 33.1  30.0 - 36.0 g/dL   RDW 14.7  11.5 - 15.5 %    Platelets 153  150 - 400 K/uL  COMPREHENSIVE METABOLIC PANEL     Status: Abnormal   Collection Time    02/09/14 12:25 PM      Result Value Ref Range   Sodium 135 (*) 137 - 147 mEq/L   Potassium 4.4  3.7 - 5.3 mEq/L   Chloride 100  96 - 112 mEq/L   CO2 22  19 - 32 mEq/L   Glucose, Bld 91  70 - 99 mg/dL   BUN 4 (*) 6 - 23 mg/dL   Creatinine, Ser 0.60  0.50 - 1.10 mg/dL   Calcium 9.5  8.4 - 10.5 mg/dL   Total Protein 6.9  6.0 - 8.3 g/dL   Albumin 3.0 (*) 3.5 - 5.2 g/dL   AST 36  0 - 37 U/L   ALT 14  0 - 35 U/L   Alkaline Phosphatase 84  39 - 117 U/L   Total Bilirubin 0.4  0.3 - 1.2 mg/dL   GFR calc non Af Amer >90  >90 mL/min   GFR calc Af Amer >90  >90 mL/min   Comment: (NOTE)     The eGFR has been calculated using the CKD EPI equation.     This calculation has not been validated in all clinical situations.     eGFR's persistently <90 mL/min signify possible Chronic Kidney     Disease.    ED Course  Assessment: Volume depletion resolved w/ intravenous hydration. Normal labs. Suspect carpal tunnel syndrome given occupation hazards.  Plan: PTL precautions Consider wrist splints if persistent Continue daily fetal movement awareness Rest/hydration Keep next OB appt   Farrel Gordon CNM, MS 02/09/2014 2:05 PM

## 2014-02-09 NOTE — MAU Note (Signed)
Patient arrived by EMS. States she was at work and had vomiting x 1, has numbness in the left arm from the elbow down into the fingers, headache, feels periods of feeling dizzy, her eyes ar "hot". Denies abdominal pain, bleeding or leaking. Reports good fetal movement.

## 2014-02-09 NOTE — Discharge Instructions (Signed)
Second Trimester of Pregnancy °The second trimester is from week 13 through week 28, months 4 through 6. The second trimester is often a time when you feel your best. Your body has also adjusted to being pregnant, and you begin to feel better physically. Usually, morning sickness has lessened or quit completely, you may have more energy, and you may have an increase in appetite. The second trimester is also a time when the fetus is growing rapidly. At the end of the sixth month, the fetus is about 9 inches long and weighs about 1½ pounds. You will likely begin to feel the baby move (quickening) between 18 and 20 weeks of the pregnancy. °BODY CHANGES °Your body goes through many changes during pregnancy. The changes vary from woman to woman.  °· Your weight will continue to increase. You will notice your lower abdomen bulging out. °· You may begin to get stretch marks on your hips, abdomen, and breasts. °· You may develop headaches that can be relieved by medicines approved by your health care provider. °· You may urinate more often because the fetus is pressing on your bladder. °· You may develop or continue to have heartburn as a result of your pregnancy. °· You may develop constipation because certain hormones are causing the muscles that push waste through your intestines to slow down. °· You may develop hemorrhoids or swollen, bulging veins (varicose veins). °· You may have back pain because of the weight gain and pregnancy hormones relaxing your joints between the bones in your pelvis and as a result of a shift in weight and the muscles that support your balance. °· Your breasts will continue to grow and be tender. °· Your gums may bleed and may be sensitive to brushing and flossing. °· Dark spots or blotches (chloasma, mask of pregnancy) may develop on your face. This will likely fade after the baby is born. °· A dark line from your belly button to the pubic area (linea nigra) may appear. This will likely fade  after the baby is born. °· You may have changes in your hair. These can include thickening of your hair, rapid growth, and changes in texture. Some women also have hair loss during or after pregnancy, or hair that feels dry or thin. Your hair will most likely return to normal after your baby is born. °WHAT TO EXPECT AT YOUR PRENATAL VISITS °During a routine prenatal visit: °· You will be weighed to make sure you and the fetus are growing normally. °· Your blood pressure will be taken. °· Your abdomen will be measured to track your baby's growth. °· The fetal heartbeat will be listened to. °· Any test results from the previous visit will be discussed. °Your health care provider may ask you: °· How you are feeling. °· If you are feeling the baby move. °· If you have had any abnormal symptoms, such as leaking fluid, bleeding, severe headaches, or abdominal cramping. °· If you have any questions. °Other tests that may be performed during your second trimester include: °· Blood tests that check for: °¨ Low iron levels (anemia). °¨ Gestational diabetes (between 24 and 28 weeks). °¨ Rh antibodies. °· Urine tests to check for infections, diabetes, or protein in the urine. °· An ultrasound to confirm the proper growth and development of the baby. °· An amniocentesis to check for possible genetic problems. °· Fetal screens for spina bifida and Down syndrome. °HOME CARE INSTRUCTIONS  °· Avoid all smoking, herbs, alcohol, and unprescribed   drugs. These chemicals affect the formation and growth of the baby.  Follow your health care provider's instructions regarding medicine use. There are medicines that are either safe or unsafe to take during pregnancy.  Exercise only as directed by your health care provider. Experiencing uterine cramps is a good sign to stop exercising.  Continue to eat regular, healthy meals.  Wear a good support bra for breast tenderness.  Do not use hot tubs, steam rooms, or saunas.  Wear your  seat belt at all times when driving.  Avoid raw meat, uncooked cheese, cat litter boxes, and soil used by cats. These carry germs that can cause birth defects in the baby.  Take your prenatal vitamins.  Try taking a stool softener (if your health care provider approves) if you develop constipation. Eat more high-fiber foods, such as fresh vegetables or fruit and whole grains. Drink plenty of fluids to keep your urine clear or pale yellow.  Take warm sitz baths to soothe any pain or discomfort caused by hemorrhoids. Use hemorrhoid cream if your health care provider approves.  If you develop varicose veins, wear support hose. Elevate your feet for 15 minutes, 3-4 times a day. Limit salt in your diet.  Avoid heavy lifting, wear low heel shoes, and practice good posture.  Rest with your legs elevated if you have leg cramps or low back pain.  Visit your dentist if you have not gone yet during your pregnancy. Use a soft toothbrush to brush your teeth and be gentle when you floss.  A sexual relationship may be continued unless your health care provider directs you otherwise.  Continue to go to all your prenatal visits as directed by your health care provider. SEEK MEDICAL CARE IF:   You have dizziness.  You have mild pelvic cramps, pelvic pressure, or nagging pain in the abdominal area.  You have persistent nausea, vomiting, or diarrhea.  You have a bad smelling vaginal discharge.  You have pain with urination. SEEK IMMEDIATE MEDICAL CARE IF:   You have a fever.  You are leaking fluid from your vagina.  You have spotting or bleeding from your vagina.  You have severe abdominal cramping or pain.  You have rapid weight gain or loss.  You have shortness of breath with chest pain.  You notice sudden or extreme swelling of your face, hands, ankles, feet, or legs.  You have not felt your baby move in over an hour.  You have severe headaches that do not go away with  medicine.  You have vision changes. Document Released: 07/25/2001 Document Revised: 08/05/2013 Document Reviewed: 10/01/2012 [facility]Exit[facility] Patient Information 2015 [place], [place]. This information is not intended to replace advice given to you by your health care provider. Make sure you discuss any questions you have with your health care provider. Nausea and Vomiting Nausea is a sick feeling that often comes before throwing up (vomiting). Vomiting is a reflex where stomach contents come out of your mouth. Vomiting can cause severe loss of body fluids (dehydration). Children and elderly adults can become dehydrated quickly, especially if they also have diarrhea. Nausea and vomiting are symptoms of a condition or disease. It is important to find the cause of your symptoms. CAUSES   Direct irritation of the stomach lining. This irritation can result from increased acid production (gastroesophageal reflux disease), infection, food poisoning, taking certain medicines (such as nonsteroidal anti-inflammatory drugs), alcohol use, or tobacco use.  Signals from the brain.These signals could be caused by a headache,  heat exposure, an inner ear disturbance, increased pressure in the brain from injury, infection, a tumor, or a concussion, pain, emotional stimulus, or metabolic problems.  An obstruction in the gastrointestinal tract (bowel obstruction).  Illnesses such as diabetes, hepatitis, gallbladder problems, appendicitis, kidney problems, cancer, sepsis, atypical symptoms of a heart attack, or eating disorders.  Medical treatments such as chemotherapy and radiation.  Receiving medicine that makes you sleep (general anesthetic) during surgery. DIAGNOSIS Your caregiver may ask for tests to be done if the problems do not improve after a few days. Tests may also be done if symptoms are severe or if the reason for the nausea and vomiting is not clear. Tests may include:  Urine tests.  Blood tests.  Stool  tests.  Cultures (to look for evidence of infection).  X-rays or other imaging studies. Test results can help your caregiver make decisions about treatment or the need for additional tests. TREATMENT You need to stay well hydrated. Drink frequently but in small amounts.You may wish to drink water, sports drinks, clear broth, or eat frozen ice pops or gelatin dessert to help stay hydrated.When you eat, eating slowly may help prevent nausea.There are also some antinausea medicines that may help prevent nausea. HOME CARE INSTRUCTIONS   Take all medicine as directed by your caregiver.  If you do not have an appetite, do not force yourself to eat. However, you must continue to drink fluids.  If you have an appetite, eat a normal diet unless your caregiver tells you differently.  Eat a variety of complex carbohydrates (rice, wheat, potatoes, bread), lean meats, yogurt, fruits, and vegetables.  Avoid high-fat foods because they are more difficult to digest.  Drink enough water and fluids to keep your urine clear or pale yellow.  If you are dehydrated, ask your caregiver for specific rehydration instructions. Signs of dehydration may include:  Severe thirst.  Dry lips and mouth.  Dizziness.  Dark urine.  Decreasing urine frequency and amount.  Confusion.  Rapid breathing or pulse. SEEK IMMEDIATE MEDICAL CARE IF:   You have blood or brown flecks (like coffee grounds) in your vomit.  You have black or bloody stools.  You have a severe headache or stiff neck.  You are confused.  You have severe abdominal pain.  You have chest pain or trouble breathing.  You do not urinate at least once every 8 hours.  You develop cold or clammy skin.  You continue to vomit for longer than 24 to 48 hours.  You have a fever. MAKE SURE YOU:   Understand these instructions.  Will watch your condition.  Will get help right away if you are not doing well or get worse. Document  Released: 07/31/2005 Document Revised: 10/23/2011 Document Reviewed: 12/28/2010 [facility]Exit[facility] Patient Information 2015 [place], [place]LL[place]. This information is not intended to replace advice given to you by your health care provider. Make sure you discuss any questions you have with your health care provider.

## 2014-03-11 ENCOUNTER — Inpatient Hospital Stay (HOSPITAL_COMMUNITY)
Admission: AD | Admit: 2014-03-11 | Discharge: 2014-03-11 | Disposition: A | Discharge status: Home / Self Care | Payer: Medicaid Other | Source: Ambulatory Visit | Attending: Obstetrics and Gynecology | Admitting: Obstetrics and Gynecology

## 2014-03-11 ENCOUNTER — Encounter (HOSPITAL_COMMUNITY): Payer: Self-pay

## 2014-03-11 DIAGNOSIS — M549 Dorsalgia, unspecified: Secondary | ICD-10-CM | POA: Insufficient documentation

## 2014-03-11 DIAGNOSIS — O9989 Other specified diseases and conditions complicating pregnancy, childbirth and the puerperium: Principal | ICD-10-CM

## 2014-03-11 DIAGNOSIS — O4100X0 Oligohydramnios, unspecified trimester, not applicable or unspecified: Secondary | ICD-10-CM | POA: Insufficient documentation

## 2014-03-11 DIAGNOSIS — R109 Unspecified abdominal pain: Secondary | ICD-10-CM | POA: Insufficient documentation

## 2014-03-11 DIAGNOSIS — O34219 Maternal care for unspecified type scar from previous cesarean delivery: Secondary | ICD-10-CM | POA: Diagnosis not present

## 2014-03-11 DIAGNOSIS — O99891 Other specified diseases and conditions complicating pregnancy: Secondary | ICD-10-CM | POA: Insufficient documentation

## 2014-03-11 LAB — URINALYSIS, ROUTINE W REFLEX MICROSCOPIC
Bilirubin Urine: NEGATIVE
GLUCOSE, UA: NEGATIVE mg/dL
Hgb urine dipstick: NEGATIVE
Ketones, ur: NEGATIVE mg/dL
LEUKOCYTES UA: NEGATIVE
Nitrite: NEGATIVE
PH: 7 (ref 5.0–8.0)
PROTEIN: NEGATIVE mg/dL
SPECIFIC GRAVITY, URINE: 1.01 (ref 1.005–1.030)
Urobilinogen, UA: 1 mg/dL (ref 0.0–1.0)

## 2014-03-11 LAB — OB RESULTS CONSOLE GC/CHLAMYDIA
Chlamydia: NEGATIVE
Gonorrhea: NEGATIVE

## 2014-03-11 LAB — WET PREP, GENITAL
Clue Cells Wet Prep HPF POC: NONE SEEN
Trich, Wet Prep: NONE SEEN
Yeast Wet Prep HPF POC: NONE SEEN

## 2014-03-11 MED ORDER — LACTATED RINGERS IV SOLN: INTRAVENOUS | Status: DC

## 2014-03-11 NOTE — MAU Note (Signed)
Pt reports lower abd and lower back pain x 24 hours. Denies bleeding, dysuria.

## 2014-03-11 NOTE — MAU Provider Note (Signed)
History   28 yo [id number] at 30 1/7 weeks presented after calling with abdominal and back pain since last night--now essentially resolved.  Had IC this am.  Denies leaking, bleeding, or dysuria, reports +FM.    Induced with first pregnancy at 36 weeks due to oligohydramnios, with NRFHR and primary C/S, then had repeat C/S.    Patient Active Problem List   Diagnosis Date Noted  . Nausea with vomiting 02/09/2014  . Status post repeat low transverse cesarean section x 2 08/31/2011  . Asthma 07/18/2011  . Sickle cell trait 07/18/2011  . 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 Abdominal Pain  . Back Pain   HPI:  As above  OB History   Grav Para Term Preterm Abortions TAB SAB Ect Mult Living   6 2 1 1 3 1 2   2       Past Medical History  Diagnosis Date  . Bacterial vaginosis   . Yeast vaginitis   . UTI (urinary tract infection)   . Asthma   . Thrombocytopenia   . Sickle cell disease   . Preterm labor   . Sickle cell trait   . Abnormal Pap smear   . Chlamydia   . HPV (human papilloma virus) infection   . UVOZDGUY(403.4Headache(784.0)     Past Surgical History  Procedure Laterality Date  . Cesarean section    . Eye surgery    . Eye surgery    . Cesarean section  08/28/2011    Procedure: CESAREAN SECTION;  Surgeon: Esmeralda ArthurSandra A Rivard, MD;  Location: [facility];  Service: Gynecology;  Laterality: N/A;  . Tooth extraction      Family History  Problem Relation Age of Onset  . Cancer Paternal Uncle   . Cancer Maternal Grandmother   . Cancer Paternal Grandfather   . Anesthesia problems Neg Hx     History  Substance Use Topics  . Smoking status: Never Smoker   . Smokeless tobacco: Never Used  . Alcohol Use: No     Comment: none with preg    Allergies:  Allergies  Allergen Reactions  . Sulfa Antibiotics Hives and Itching    No prescriptions prior to admission    ROS:  Abdominal cramping, low back pain, +FM Physical  Exam   Blood pressure 116/67, pulse 91, temperature 99.1 F (37.3 C), temperature source Oral, resp. rate 18, height 5\' 4"  (1.626 m), weight 174 lb (78.926 kg), last menstrual period 08/12/2013, SpO2 100.00%.  Physical Exam In NAD Chest clear Heart RRR without murmur Abd gravid, NT Back--negative CVAT Pelvic--cervix closed, long, pp OOP, small amount white d/c in vault Ext WNL  FHR Category 1 Toco:  No defined UCs, occasional very mild irritability     Results for orders placed during the hospital encounter of 03/11/14 (from the past 24 hour(s))  URINALYSIS, ROUTINE W REFLEX MICROSCOPIC     Status: None   Collection Time    03/11/14  7:10 PM      Result Value Ref Range   Color, Urine YELLOW  YELLOW   APPearance CLEAR  CLEAR   Specific Gravity, Urine 1.010  1.005 - 1.030   pH 7.0  5.0 - 8.0   Glucose, UA NEGATIVE  NEGATIVE mg/dL   Hgb urine dipstick NEGATIVE  NEGATIVE   Bilirubin Urine NEGATIVE  NEGATIVE   Ketones, ur NEGATIVE  NEGATIVE mg/dL   Protein, ur NEGATIVE  NEGATIVE mg/dL   Urobilinogen,  UA 1.0  0.0 - 1.0 mg/dL   Nitrite NEGATIVE  NEGATIVE   Leukocytes, UA NEGATIVE  NEGATIVE  WET PREP, GENITAL     Status: Abnormal   Collection Time    03/11/14  7:55 PM      Result Value Ref Range   Yeast Wet Prep HPF POC NONE SEEN  NONE SEEN   Trich, Wet Prep NONE SEEN  NONE SEEN   Clue Cells Wet Prep HPF POC NONE SEEN  NONE SEEN   WBC, Wet Prep HPF POC FEW (*) NONE SEEN   GC, chlamydia, GBS pending. FFN deferred due to recent IC, and no cervical change.  ED Course  Assessment: IUP at 30 1/7 weeks Musculoskeletal discomforts--no evidence PTL  Plan: D/C home with PTL precautions. OOW tomorrow (patient has 2 jobs--notes given) To push fluids and rest, call with any issues.   Nigel Bridgeman CNM, MSN 03/11/2014 10:00 PM

## 2014-03-11 NOTE — Discharge Instructions (Signed)

## 2014-03-12 LAB — GC/CHLAMYDIA PROBE AMP
CT PROBE, AMP APTIMA: NEGATIVE
GC PROBE AMP APTIMA: NEGATIVE

## 2014-03-12 LAB — URINE CULTURE
COLONY COUNT: NO GROWTH
CULTURE: NO GROWTH

## 2014-03-13 LAB — CULTURE, BETA STREP (GROUP B ONLY)

## 2014-04-08 ENCOUNTER — Other Ambulatory Visit: Payer: Self-pay | Admitting: Obstetrics and Gynecology

## 2014-04-27 ENCOUNTER — Encounter (HOSPITAL_COMMUNITY): Payer: Self-pay | Admitting: Pharmacist

## 2014-05-05 NOTE — H&P (Signed)
[name] is a 28 y.o. female, G6 P2032 at  39.0 weeks, repeat CS   Patient Active Problem List   Diagnosis Date Noted  . Nausea with vomiting 02/09/2014  . Status post repeat low transverse cesarean section x 2 08/31/2011  . Asthma 07/18/2011  . Sickle cell trait 07/18/2011  . ITP (idiopathic thrombocytopenic purpura) 07/18/2011    Pregnancy Course: Patient entered care at 14.6 weeks.   EDC of 05/19/14 was established by EDD.      [place] evaluations:  11.2 weeks - Dating: FHR 124, [place] EDD 05/16/14, small amount of subchorionic hemorrhage along the   inferior placental margin 19.3 weeks - Anatomy:  EFW 11oz, FHR 164, anatomy wnl, vertex, posterior placenta, female    Significant prenatal events:   [place]   Last evaluation:   05/12/14 weeks    Reason for admission:  Repeat CS  Pt States:     Contractions Frequency: n/a      Contraction severity: n/a      Fetal activity: +FM  OB History   Grav Para Term Preterm Abortions TAB SAB Ect Mult Living   Past Medical History  Diagnosis Date  . Bacterial vaginosis   . Yeast vaginitis   . UTI (urinary tract infection)   . Asthma   . Thrombocytopenia   . Sickle cell disease   . Preterm labor   . Sickle cell trait   . Abnormal Pap smear   . Chlamydia   . HPV (human papilloma virus) infection   . ZOXWRUEA(540.9)    Past Surgical History  Procedure Laterality Date  . Cesarean section    . Eye surgery    . Eye surgery    . Cesarean section  08/28/2011    Procedure: CESAREAN SECTION;  Surgeon: Esmeralda Arthur, MD;  Location: [facility];  Service: Gynecology;  Laterality: N/A;  . Tooth extraction     Family History: family history includes Cancer in her maternal grandmother, paternal grandfather, and paternal uncle. There is no history of Anesthesia problems. Social History:  reports that she has never smoked. She has never used smokeless tobacco. She reports that she does not drink alcohol or use illicit  drugs.   Prenatal Transfer Tool  Maternal Diabetes: No Genetic Screening: Declined Maternal Ultrasounds/Referrals: Normal Fetal Ultrasounds or other Referrals:  None Maternal Substance Abuse:  No Significant Maternal Medications:  None Significant Maternal Lab Results: Lab values include: Other: Trich 05/2012, ABN pap 2007, AS   ROS:  See HPI above, all other systems are negative  Allergies  Allergen Reactions  . Sulfa Antibiotics Hives and Itching      Last menstrual period 08/12/2013.  Maternal Exam:  Uterine Assessment: Contraction frequency is rare.  Abdomen: Gravid, non tender. Fundal height is aga.  Normal external genitalia, vulva, cervix, uterus and adnexa. No lesions noted on exam.  Pelvis adequate for delivery.  Fetal presentation: Vertex by Leopold's   Fetal Exam:  Monitor Surveillance : Continuous Monitoring Mode: Ultrasound.  NICHD: Category CTXs: n/a EFW   7 lbs  Physical Exam: Nursing note and vitals reviewed General: alert and cooperative She appears well nourished.  Psychiatric: Normal mood and affect. Her behavior is normal.  Head: Normocephalic.  Eyes: Pupils are equal, round, and reactive to light.  Neck: Normal range of motion.  Cardiovascular: RRR without murmur.  Respiratory: CTAB. Effort normal.  Abd: soft, non-tender, +BS, no rebound, no  guarding  Genitourinary: Vagina normal.  Musculoskeletal: Normal range of motion.  Ext: Homan's sign negative bilaterally. No evidence of DVTs.  No Edema DTR + Clonus neg Neurological: A&Ox3.  Skin: Warm and dry.   Prenatal labs: ABO, Rh:  A postive Antibody:  negative Rubella:   immune RPR:   NR HBsAg:   negative HIV:   negative GBS:  negative 03/11/14 Sickle cell/Hgb electrophoresis:  AS Pap:  Abn 2007, HPV + GC:   negative Chlamydia: negative Genetic screenings:   Glucola:  wnl Varicella: Non immune  Assessment:  IUP at 39.0 weeks NICHD: Category Membranes: intact GBS  negative Diagnosis: repeat CS  Plan:  Admit to BS for schedule repeat CS R&B of CS reviewed with patient and family.  Pt and family verbalize understanding of the procedure and agree with treatment plan.  Possibility of needing a blood transfusion reviewed.   Pt will accept blood products.   Routine Pre-OP orders Ancef per protocol May have a clear/thin diet 6 hours after CS, advance as tolerate 12 hours after CS Date of Initial H&P: 9/22  History reviewed, patient examined, no change in status, stable for surgery.      [name], CNM, MSN 05/05/2014, 1:22 PM

## 2014-05-09 ENCOUNTER — Encounter (HOSPITAL_COMMUNITY): Payer: Self-pay | Admitting: *Deleted

## 2014-05-09 ENCOUNTER — Inpatient Hospital Stay (HOSPITAL_COMMUNITY)
Admission: AD | Admit: 2014-05-09 | Discharge: 2014-05-10 | Disposition: A | Discharge status: Home / Self Care | Payer: Medicaid Other | Source: Ambulatory Visit | Attending: Obstetrics and Gynecology | Admitting: Obstetrics and Gynecology

## 2014-05-09 DIAGNOSIS — O99891 Other specified diseases and conditions complicating pregnancy: Secondary | ICD-10-CM | POA: Insufficient documentation

## 2014-05-09 DIAGNOSIS — O34219 Maternal care for unspecified type scar from previous cesarean delivery: Secondary | ICD-10-CM | POA: Insufficient documentation

## 2014-05-09 DIAGNOSIS — O9989 Other specified diseases and conditions complicating pregnancy, childbirth and the puerperium: Principal | ICD-10-CM

## 2014-05-09 NOTE — MAU Note (Addendum)
PT  SAYS HE STARTED HURTING BAD AT 945PM   VE IN OFFICE -  LAST WEEK- 1 CM.     DENIES HSV AND MRSA.  GBS- NEG.  IS [facility]  FOR C/S ON Tuesday-  REPEAT.   FIRST C/S-  DECREASE FHR

## 2014-05-09 NOTE — MAU Note (Signed)
Panties wet all day. Contractions since 2100. No bleeding

## 2014-05-10 DIAGNOSIS — O34219 Maternal care for unspecified type scar from previous cesarean delivery: Secondary | ICD-10-CM | POA: Diagnosis not present

## 2014-05-10 DIAGNOSIS — O99891 Other specified diseases and conditions complicating pregnancy: Secondary | ICD-10-CM | POA: Diagnosis not present

## 2014-05-10 DIAGNOSIS — O479 False labor, unspecified: Secondary | ICD-10-CM | POA: Diagnosis present

## 2014-05-10 NOTE — Discharge Instructions (Signed)
Braxton Hicks Contractions °Contractions of the uterus can occur throughout pregnancy. Contractions are not always a sign that you are in labor.  °WHAT ARE BRAXTON HICKS CONTRACTIONS?  °Contractions that occur before labor are called Braxton Hicks contractions, or false labor. Toward the end of pregnancy (32-34 weeks), these contractions can develop more often and may become more forceful. This is not true labor because these contractions do not result in opening (dilatation) and thinning of the cervix. They are sometimes difficult to tell apart from true labor because these contractions can be forceful and people have different pain tolerances. You should not feel embarrassed if you go to the hospital with false labor. Sometimes, the only way to tell if you are in true labor is for your health care provider to look for changes in the cervix. °If there are no prenatal problems or other health problems associated with the pregnancy, it is completely safe to be sent home with false labor and await the onset of true labor. °HOW CAN YOU TELL THE DIFFERENCE BETWEEN TRUE AND FALSE LABOR? °False Labor °· The contractions of false labor are usually shorter and not as hard as those of true labor.   °· The contractions are usually irregular.   °· The contractions are often felt in the front of the lower abdomen and in the groin.   °· The contractions may go away when you walk around or change positions while lying down.   °· The contractions get weaker and are shorter lasting as time goes on.   °· The contractions do not usually become progressively stronger, regular, and closer together as with true labor.   °True Labor °· Contractions in true labor last 30-70 seconds, become very regular, usually become more intense, and increase in frequency.   °· The contractions do not go away with walking.   °· The discomfort is usually felt in the top of the uterus and spreads to the lower abdomen and low back.   °· True labor can be  determined by your health care provider with an exam. This will show that the cervix is dilating and getting thinner.   °WHAT TO REMEMBER °· Keep up with your usual exercises and follow other instructions given by your health care provider.   °· Take medicines as directed by your health care provider.   °· Keep your regular prenatal appointments.   °· Eat and drink lightly if you think you are going into labor.   °· If Braxton Hicks contractions are making you uncomfortable:   °¨ Change your position from lying down or resting to walking, or from walking to resting.   °¨ Sit and rest in a tub of warm water.   °¨ Drink 2-3 glasses of water. Dehydration may cause these contractions.   °¨ Do slow and deep breathing several times an hour.   °WHEN SHOULD I SEEK IMMEDIATE MEDICAL CARE? °Seek immediate medical care if: °· Your contractions become stronger, more regular, and closer together.   °· You have fluid leaking or gushing from your vagina.   °· You have a fever.   °· You pass blood-tinged mucus.   °· You have vaginal bleeding.   °· You have continuous abdominal pain.   °· You have low back pain that you never had before.   °· You feel your baby's head pushing down and causing pelvic pressure.   °· Your baby is not moving as much as it used to.   °Document Released: 07/31/2005 Document Revised: 08/05/2013 Document Reviewed: 05/12/2013 °ExitCare® Patient Information ©2015 [employer], [employer]. This information is not intended to replace advice given to you by your health care   provider. Make sure you discuss any questions you have with your health care provider. ° °

## 2014-05-10 NOTE — MAU Provider Note (Signed)
History  28 yo [id number] @ 38.5 wks presents to MAU w/ c/o ctxs q 3-5 min over the past hour that are becoming progressively worse. Reports active fetus. Denies VB. Unsure of LOF.   Scheduled for repeat c-section on 05/12/14.  Patient Active Problem List   Diagnosis Date Noted  . Braxton Hicks contractions 05/10/2014  . Nausea with vomiting 02/09/2014  . Status post repeat low transverse cesarean section x 2 08/31/2011  . Asthma 07/18/2011  . Sickle cell trait 07/18/2011  . 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 Contractions  . Rupture of Membranes   HPI See above OB History   Grav Para Term Preterm Abortions TAB SAB Ect Mult Living   Past Medical History  Diagnosis Date  . Bacterial vaginosis   . Yeast vaginitis   . UTI (urinary tract infection)   . Asthma   . Thrombocytopenia   . Sickle cell disease   . Preterm labor   . Sickle cell trait   . Abnormal Pap smear   . Chlamydia   . HPV (human papilloma virus) infection   . UUVOZDGU(440.3)     Past Surgical History  Procedure Laterality Date  . Cesarean section    . Eye surgery    . Eye surgery    . Cesarean section  08/28/2011    Procedure: CESAREAN SECTION;  Surgeon: Esmeralda Arthur, MD;  Location: [facility];  Service: Gynecology;  Laterality: N/A;  . Tooth extraction      Family History  Problem Relation Age of Onset  . Cancer Paternal Uncle   . Cancer Maternal Grandmother   . Cancer Paternal Grandfather   . Anesthesia problems Neg Hx     History  Substance Use Topics  . Smoking status: Never Smoker   . Smokeless tobacco: Never Used  . Alcohol Use: No     Comment: none with preg    Allergies:  Allergies  Allergen Reactions  . Sulfa Antibiotics Hives and Itching    Prescriptions prior to admission  Medication Sig Dispense Refill  . Prenatal Vit-Fe Fumarate-FA (PRENATAL MULTIVITAMIN) TABS tablet Take 1 tablet by  mouth daily at 12 noon.        ROS Ctxs ?LOF Physical Exam   Blood pressure 131/64, pulse 75, temperature 98.2 F (36.8 C), resp. rate 20, height  (1.626 m), weight 172 lb 8 oz (78.245 kg), last menstrual period 08/12/2013.  Physical Exam Gen: NAD SSE: Neg valsalva, neg pooling, neg fern Cvx: FT/thick/high FHRT: Baseline FHR 116 w/ mod variability, +accels, no decels Rare ctxs ED Course  Assessment: Cat 1 FHRT Neg ROM Not in labor  Plan: Labor precautions Keep next OB appt   Sherre Scarlet CNM, MS 05/10/2014 12:20 AM

## 2014-05-11 ENCOUNTER — Encounter (HOSPITAL_COMMUNITY): Payer: Self-pay

## 2014-05-11 ENCOUNTER — Encounter (HOSPITAL_COMMUNITY)
Admission: RE | Admit: 2014-05-11 | Discharge: 2014-05-11 | Disposition: A | Discharge status: Home / Self Care | Payer: Medicaid Other | Source: Ambulatory Visit | Attending: Obstetrics and Gynecology | Admitting: Obstetrics and Gynecology

## 2014-05-11 HISTORY — DX: Missed abortion: O02.1

## 2014-05-11 HISTORY — DX: Anxiety disorder, unspecified: F41.9

## 2014-05-11 HISTORY — DX: Encounter for elective termination of pregnancy: Z33.2

## 2014-05-11 LAB — CBC
HCT: 33 % — ABNORMAL LOW (ref 36.0–46.0)
Hemoglobin: 11.1 g/dL — ABNORMAL LOW (ref 12.0–15.0)
MCH: 26.3 pg (ref 26.0–34.0)
MCHC: 33.6 g/dL (ref 30.0–36.0)
MCV: 78.2 fL (ref 78.0–100.0)
PLATELETS: 142 10*3/uL — AB (ref 150–400)
RBC: 4.22 MIL/uL (ref 3.87–5.11)
RDW: 15.7 % — AB (ref 11.5–15.5)
WBC: 8.1 10*3/uL (ref 4.0–10.5)

## 2014-05-11 LAB — RPR

## 2014-05-11 LAB — TYPE AND SCREEN
ABO/RH(D): A POS
Antibody Screen: NEGATIVE

## 2014-05-11 LAB — ABO/RH: ABO/RH(D): A POS

## 2014-05-11 NOTE — Patient Instructions (Addendum)
   Your procedure is scheduled on: TUESDAY, SEPT 29  Enter through the Main Entrance of [facility] at: 1000 am Pick up the phone at the desk and dial [phone] and inform us of your arrival.  Please call this number if you have any problems the morning of surgery: [phone]  Remember: Do not eat food after midnight: MONDAY Take these medicines the morning of surgery with a SIP OF WATER: NONE  Do not wear jewelry, make-up, or FINGER nail polish No metal in your hair or on your body. Do not wear lotions, powders, perfumes.  You may wear deodorant.  Do not bring valuables to the hospital. Contacts, dentures or bridgework may not be worn into surgery.  Leave suitcase in the car. After Surgery it may be brought to your room. For patients being admitted to the hospital, checkout time is 11:00am the day of discharge.  HOME WITH FOB [name] CELL [phone]

## 2014-05-12 ENCOUNTER — Encounter (HOSPITAL_COMMUNITY)
Admission: RE | Disposition: A | Discharge status: Home / Self Care | Payer: Self-pay | Source: Ambulatory Visit | Attending: Obstetrics and Gynecology

## 2014-05-12 ENCOUNTER — Encounter (HOSPITAL_COMMUNITY): Payer: Self-pay | Admitting: General Surgery

## 2014-05-12 ENCOUNTER — Inpatient Hospital Stay (HOSPITAL_COMMUNITY): Payer: Medicaid Other | Admitting: Anesthesiology

## 2014-05-12 ENCOUNTER — Encounter (HOSPITAL_COMMUNITY): Payer: Medicaid Other | Admitting: Anesthesiology

## 2014-05-12 ENCOUNTER — Inpatient Hospital Stay (HOSPITAL_COMMUNITY)
Admission: RE | Admit: 2014-05-12 | Discharge: 2014-05-15 | DRG: 765 | Disposition: A | Discharge status: Home / Self Care | Payer: Medicaid Other | Source: Ambulatory Visit | Attending: Obstetrics and Gynecology | Admitting: Obstetrics and Gynecology

## 2014-05-12 DIAGNOSIS — Z3A39 39 weeks gestation of pregnancy: Secondary | ICD-10-CM | POA: Diagnosis present

## 2014-05-12 DIAGNOSIS — O99344 Other mental disorders complicating childbirth: Secondary | ICD-10-CM | POA: Diagnosis present

## 2014-05-12 DIAGNOSIS — O9912 Other diseases of the blood and blood-forming organs and certain disorders involving the immune mechanism complicating childbirth: Secondary | ICD-10-CM | POA: Diagnosis not present

## 2014-05-12 DIAGNOSIS — O9902 Anemia complicating childbirth: Secondary | ICD-10-CM | POA: Diagnosis present

## 2014-05-12 DIAGNOSIS — F418 Other specified anxiety disorders: Secondary | ICD-10-CM | POA: Diagnosis present

## 2014-05-12 DIAGNOSIS — D573 Sickle-cell trait: Secondary | ICD-10-CM | POA: Diagnosis present

## 2014-05-12 DIAGNOSIS — D693 Immune thrombocytopenic purpura: Secondary | ICD-10-CM | POA: Diagnosis present

## 2014-05-12 DIAGNOSIS — O3421 Maternal care for scar from previous cesarean delivery: Secondary | ICD-10-CM | POA: Diagnosis not present

## 2014-05-12 DIAGNOSIS — O34219 Maternal care for unspecified type scar from previous cesarean delivery: Secondary | ICD-10-CM | POA: Diagnosis not present

## 2014-05-12 DIAGNOSIS — Z98891 History of uterine scar from previous surgery: Secondary | ICD-10-CM

## 2014-05-12 SURGERY — Surgical Case
Anesthesia: Spinal

## 2014-05-12 MED ORDER — MEPERIDINE HCL 25 MG/ML IJ SOLN: 6.2500 mg | INTRAMUSCULAR | Status: DC | PRN

## 2014-05-12 MED ORDER — PHENYLEPHRINE 40 MCG/ML (10ML) SYRINGE FOR IV PUSH (FOR BLOOD PRESSURE SUPPORT)
PREFILLED_SYRINGE | INTRAVENOUS | Status: AC
  Filled 2014-05-12: qty 5

## 2014-05-12 MED ORDER — PHENYLEPHRINE 8 MG IN D5W 100 ML (0.08MG/ML) PREMIX OPTIME
INJECTION | INTRAVENOUS | Status: DC | PRN
  Administered 2014-05-12: 60 ug/min via INTRAVENOUS

## 2014-05-12 MED ORDER — IBUPROFEN 600 MG PO TABS: 600.0000 mg | ORAL_TABLET | Freq: Four times a day (QID) | ORAL | Status: DC | PRN

## 2014-05-12 MED ORDER — PRENATAL MULTIVITAMIN CH
1.0000 | ORAL_TABLET | Freq: Every day | ORAL | Status: DC
  Administered 2014-05-13 – 2014-05-14 (×2): 1 via ORAL
  Filled 2014-05-12 (×2): qty 1

## 2014-05-12 MED ORDER — PROMETHAZINE HCL 25 MG/ML IJ SOLN: 6.2500 mg | INTRAMUSCULAR | Status: DC | PRN

## 2014-05-12 MED ORDER — LACTATED RINGERS IV SOLN
Freq: Once | INTRAVENOUS | Status: AC
  Administered 2014-05-12: 10:00:00 via INTRAVENOUS

## 2014-05-12 MED ORDER — DIBUCAINE 1 % RE OINT
1.0000 | TOPICAL_OINTMENT | RECTAL | Status: DC | PRN
Start: 2014-05-12 — End: 2014-05-15

## 2014-05-12 MED ORDER — MIDAZOLAM HCL 2 MG/2ML IJ SOLN: 0.5000 mg | Freq: Once | INTRAMUSCULAR | Status: DC | PRN

## 2014-05-12 MED ORDER — ONDANSETRON HCL 4 MG/2ML IJ SOLN: 4.0000 mg | INTRAMUSCULAR | Status: DC | PRN

## 2014-05-12 MED ORDER — SIMETHICONE 80 MG PO CHEW
80.0000 mg | CHEWABLE_TABLET | Freq: Three times a day (TID) | ORAL | Status: DC
  Administered 2014-05-12 – 2014-05-14 (×8): 80 mg via ORAL
  Filled 2014-05-12 (×7): qty 1

## 2014-05-12 MED ORDER — ONDANSETRON HCL 4 MG/2ML IJ SOLN
INTRAMUSCULAR | Status: AC
  Filled 2014-05-12: qty 2

## 2014-05-12 MED ORDER — IBUPROFEN 600 MG PO TABS
600.0000 mg | ORAL_TABLET | Freq: Four times a day (QID) | ORAL | Status: DC
  Administered 2014-05-12 – 2014-05-15 (×11): 600 mg via ORAL
  Filled 2014-05-12 (×12): qty 1

## 2014-05-12 MED ORDER — LACTATED RINGERS IV SOLN
INTRAVENOUS | Status: DC
  Administered 2014-05-13: 1000 mL via INTRAVENOUS

## 2014-05-12 MED ORDER — FLEET ENEMA 7-19 GM/118ML RE ENEM: 1.0000 | ENEMA | Freq: Every day | RECTAL | Status: DC | PRN

## 2014-05-12 MED ORDER — SODIUM CHLORIDE 0.9 % IJ SOLN: 3.0000 mL | INTRAMUSCULAR | Status: DC | PRN

## 2014-05-12 MED ORDER — NALOXONE HCL 0.4 MG/ML IJ SOLN: 0.4000 mg | INTRAMUSCULAR | Status: DC | PRN

## 2014-05-12 MED ORDER — CEFAZOLIN SODIUM-DEXTROSE 2-3 GM-% IV SOLR
INTRAVENOUS | Status: AC
  Administered 2014-05-12: 2 g via INTRAVENOUS
  Filled 2014-05-12: qty 50

## 2014-05-12 MED ORDER — MORPHINE SULFATE (PF) 0.5 MG/ML IJ SOLN
INTRAMUSCULAR | Status: DC | PRN
  Administered 2014-05-12: .15 mg via INTRATHECAL

## 2014-05-12 MED ORDER — KETOROLAC TROMETHAMINE 30 MG/ML IJ SOLN
30.0000 mg | Freq: Four times a day (QID) | INTRAMUSCULAR | Status: AC | PRN
Start: 2014-05-12 — End: 2014-05-13

## 2014-05-12 MED ORDER — OXYTOCIN 40 UNITS IN LACTATED RINGERS INFUSION - SIMPLE MED
INTRAVENOUS | Status: DC | PRN
  Administered 2014-05-12: 40 [IU] via INTRAVENOUS

## 2014-05-12 MED ORDER — SIMETHICONE 80 MG PO CHEW: 80.0000 mg | CHEWABLE_TABLET | ORAL | Status: DC | PRN

## 2014-05-12 MED ORDER — DIPHENHYDRAMINE HCL 25 MG PO CAPS
25.0000 mg | ORAL_CAPSULE | ORAL | Status: DC | PRN
  Administered 2014-05-13 (×3): 25 mg via ORAL
  Filled 2014-05-12 (×4): qty 1

## 2014-05-12 MED ORDER — LANOLIN HYDROUS EX OINT: 1.0000 "application " | TOPICAL_OINTMENT | CUTANEOUS | Status: DC | PRN

## 2014-05-12 MED ORDER — OXYCODONE-ACETAMINOPHEN 5-325 MG PO TABS
2.0000 | ORAL_TABLET | ORAL | Status: DC | PRN
  Administered 2014-05-13 – 2014-05-14 (×3): 2 via ORAL
  Filled 2014-05-12 (×3): qty 2

## 2014-05-12 MED ORDER — ONDANSETRON HCL 4 MG/2ML IJ SOLN: 4.0000 mg | Freq: Three times a day (TID) | INTRAMUSCULAR | Status: DC | PRN

## 2014-05-12 MED ORDER — FENTANYL CITRATE 0.05 MG/ML IJ SOLN
INTRAMUSCULAR | Status: AC
  Administered 2014-05-12: 50 ug via INTRAVENOUS
  Filled 2014-05-12: qty 2

## 2014-05-12 MED ORDER — DIPHENHYDRAMINE HCL 50 MG/ML IJ SOLN
12.5000 mg | INTRAMUSCULAR | Status: DC | PRN
  Administered 2014-05-13: 12.5 mg via INTRAVENOUS
  Filled 2014-05-12: qty 1

## 2014-05-12 MED ORDER — OXYCODONE-ACETAMINOPHEN 5-325 MG PO TABS
1.0000 | ORAL_TABLET | ORAL | Status: DC | PRN
  Administered 2014-05-13 – 2014-05-15 (×5): 1 via ORAL
  Filled 2014-05-12 (×5): qty 1

## 2014-05-12 MED ORDER — KETOROLAC TROMETHAMINE 30 MG/ML IJ SOLN: 15.0000 mg | Freq: Once | INTRAMUSCULAR | Status: DC | PRN

## 2014-05-12 MED ORDER — 0.9 % SODIUM CHLORIDE (POUR BTL) OPTIME
TOPICAL | Status: DC | PRN
  Administered 2014-05-12: 1000 mL

## 2014-05-12 MED ORDER — OXYTOCIN 40 UNITS IN LACTATED RINGERS INFUSION - SIMPLE MED: 62.5000 mL/h | INTRAVENOUS | Status: AC

## 2014-05-12 MED ORDER — DIPHENHYDRAMINE HCL 25 MG PO CAPS: 25.0000 mg | ORAL_CAPSULE | Freq: Four times a day (QID) | ORAL | Status: DC | PRN

## 2014-05-12 MED ORDER — NALBUPHINE HCL 10 MG/ML IJ SOLN
5.0000 mg | Freq: Once | INTRAMUSCULAR | Status: AC | PRN
  Administered 2014-05-12: 5 mg via SUBCUTANEOUS

## 2014-05-12 MED ORDER — MENTHOL 3 MG MT LOZG
1.0000 | LOZENGE | OROMUCOSAL | Status: DC | PRN
  Filled 2014-05-12: qty 9

## 2014-05-12 MED ORDER — BISACODYL 10 MG RE SUPP: 10.0000 mg | Freq: Every day | RECTAL | Status: DC | PRN

## 2014-05-12 MED ORDER — WITCH HAZEL-GLYCERIN EX PADS: 1.0000 | MEDICATED_PAD | CUTANEOUS | Status: DC | PRN

## 2014-05-12 MED ORDER — MEPERIDINE HCL 25 MG/ML IJ SOLN
INTRAMUSCULAR | Status: AC
  Filled 2014-05-12: qty 1

## 2014-05-12 MED ORDER — DEXTROSE 5 % IV SOLN
1.0000 ug/kg/h | INTRAVENOUS | Status: DC | PRN
  Filled 2014-05-12: qty 2

## 2014-05-12 MED ORDER — ONDANSETRON HCL 4 MG PO TABS: 4.0000 mg | ORAL_TABLET | ORAL | Status: DC | PRN

## 2014-05-12 MED ORDER — DIPHENHYDRAMINE HCL 50 MG/ML IJ SOLN
INTRAMUSCULAR | Status: DC | PRN
  Administered 2014-05-12: 25 mg via INTRAVENOUS

## 2014-05-12 MED ORDER — ACETAMINOPHEN 500 MG PO TABS
1000.0000 mg | ORAL_TABLET | Freq: Four times a day (QID) | ORAL | Status: AC
  Administered 2014-05-12 – 2014-05-13 (×3): 1000 mg via ORAL
  Filled 2014-05-12 (×3): qty 2

## 2014-05-12 MED ORDER — MORPHINE SULFATE 0.5 MG/ML IJ SOLN
INTRAMUSCULAR | Status: AC
  Filled 2014-05-12: qty 10

## 2014-05-12 MED ORDER — ZOLPIDEM TARTRATE 5 MG PO TABS: 5.0000 mg | ORAL_TABLET | Freq: Every evening | ORAL | Status: DC | PRN

## 2014-05-12 MED ORDER — FENTANYL CITRATE 0.05 MG/ML IJ SOLN
INTRAMUSCULAR | Status: AC
Start: 2014-05-12 — End: 2014-05-12
  Filled 2014-05-12: qty 2

## 2014-05-12 MED ORDER — FENTANYL CITRATE 0.05 MG/ML IJ SOLN
25.0000 ug | INTRAMUSCULAR | Status: DC | PRN
  Administered 2014-05-12: 50 ug via INTRAVENOUS

## 2014-05-12 MED ORDER — BUPIVACAINE IN DEXTROSE 0.75-8.25 % IT SOLN
INTRATHECAL | Status: DC | PRN
  Administered 2014-05-12: 1.5 mL via INTRATHECAL

## 2014-05-12 MED ORDER — FENTANYL CITRATE 0.05 MG/ML IJ SOLN
INTRAMUSCULAR | Status: DC | PRN
  Administered 2014-05-12: 25 ug via INTRATHECAL

## 2014-05-12 MED ORDER — LACTATED RINGERS IV SOLN
INTRAVENOUS | Status: DC | PRN
  Administered 2014-05-12 (×3): via INTRAVENOUS

## 2014-05-12 MED ORDER — SCOPOLAMINE 1 MG/3DAYS TD PT72
1.0000 | MEDICATED_PATCH | Freq: Once | TRANSDERMAL | Status: AC
  Administered 2014-05-12: 1.5 mg via TRANSDERMAL

## 2014-05-12 MED ORDER — NALBUPHINE HCL 10 MG/ML IJ SOLN
INTRAMUSCULAR | Status: AC
  Administered 2014-05-12: 5 mg via SUBCUTANEOUS
  Filled 2014-05-12: qty 1

## 2014-05-12 MED ORDER — KETOROLAC TROMETHAMINE 30 MG/ML IJ SOLN: 30.0000 mg | Freq: Four times a day (QID) | INTRAMUSCULAR | Status: AC | PRN

## 2014-05-12 MED ORDER — MEPERIDINE HCL 25 MG/ML IJ SOLN
6.2500 mg | INTRAMUSCULAR | Status: DC | PRN
  Administered 2014-05-12: 12.5 mg via INTRAVENOUS

## 2014-05-12 MED ORDER — FERROUS SULFATE 325 (65 FE) MG PO TABS
325.0000 mg | ORAL_TABLET | Freq: Two times a day (BID) | ORAL | Status: DC
  Administered 2014-05-12 – 2014-05-14 (×5): 325 mg via ORAL
  Filled 2014-05-12 (×5): qty 1

## 2014-05-12 MED ORDER — SIMETHICONE 80 MG PO CHEW
80.0000 mg | CHEWABLE_TABLET | ORAL | Status: DC
  Administered 2014-05-13 – 2014-05-15 (×3): 80 mg via ORAL
  Filled 2014-05-12 (×3): qty 1

## 2014-05-12 MED ORDER — SCOPOLAMINE 1 MG/3DAYS TD PT72
MEDICATED_PATCH | TRANSDERMAL | Status: AC
  Administered 2014-05-12: 1.5 mg via TRANSDERMAL
  Filled 2014-05-12: qty 1

## 2014-05-12 MED ORDER — OXYTOCIN 10 UNIT/ML IJ SOLN
INTRAMUSCULAR | Status: AC
  Filled 2014-05-12: qty 4

## 2014-05-12 MED ORDER — DIPHENHYDRAMINE HCL 50 MG/ML IJ SOLN
INTRAMUSCULAR | Status: AC
  Filled 2014-05-12: qty 1

## 2014-05-12 MED ORDER — SENNOSIDES-DOCUSATE SODIUM 8.6-50 MG PO TABS
2.0000 | ORAL_TABLET | ORAL | Status: DC
  Administered 2014-05-13 – 2014-05-14 (×3): 2 via ORAL
  Filled 2014-05-12 (×3): qty 2

## 2014-05-12 MED ORDER — NALBUPHINE HCL 10 MG/ML IJ SOLN: 5.0000 mg | Freq: Once | INTRAMUSCULAR | Status: AC | PRN

## 2014-05-12 MED ORDER — PHENYLEPHRINE 8 MG IN D5W 100 ML (0.08MG/ML) PREMIX OPTIME
INJECTION | INTRAVENOUS | Status: AC
  Filled 2014-05-12: qty 100

## 2014-05-12 MED ORDER — INFLUENZA VAC SPLIT QUAD 0.5 ML IM SUSY
0.5000 mL | PREFILLED_SYRINGE | INTRAMUSCULAR | Status: AC
  Administered 2014-05-13: 0.5 mL via INTRAMUSCULAR
  Filled 2014-05-12: qty 0.5

## 2014-05-12 MED ORDER — NALBUPHINE HCL 10 MG/ML IJ SOLN
5.0000 mg | INTRAMUSCULAR | Status: DC | PRN
  Filled 2014-05-12: qty 1

## 2014-05-12 MED ORDER — CEFAZOLIN SODIUM-DEXTROSE 2-3 GM-% IV SOLR: 2.0000 g | INTRAVENOUS | Status: DC

## 2014-05-12 MED ORDER — SCOPOLAMINE 1 MG/3DAYS TD PT72: 1.0000 | MEDICATED_PATCH | Freq: Once | TRANSDERMAL | Status: DC

## 2014-05-12 MED ORDER — ONDANSETRON HCL 4 MG/2ML IJ SOLN
INTRAMUSCULAR | Status: DC | PRN
  Administered 2014-05-12: 4 mg via INTRAVENOUS

## 2014-05-12 MED ORDER — NALBUPHINE HCL 10 MG/ML IJ SOLN
5.0000 mg | INTRAMUSCULAR | Status: DC | PRN
  Administered 2014-05-12: 19:00:00 via INTRAVENOUS
  Administered 2014-05-13: 5 mg via INTRAVENOUS
  Administered 2014-05-13: 10:00:00 via INTRAVENOUS
  Administered 2014-05-13: 5 mg via INTRAVENOUS
  Filled 2014-05-12 (×3): qty 1

## 2014-05-12 SURGICAL SUPPLY — 46 items
APL SKNCLS STERI-STRIP NONHPOA (GAUZE/BANDAGES/DRESSINGS) ×1
BENZOIN TINCTURE PRP APPL 2/3 (GAUZE/BANDAGES/DRESSINGS) ×3 IMPLANT
CLAMP CORD UMBIL (MISCELLANEOUS) IMPLANT
CLOSURE WOUND 1/2 X4 (GAUZE/BANDAGES/DRESSINGS) ×1
CLOTH BEACON ORANGE TIMEOUT ST (SAFETY) ×3 IMPLANT
CONTAINER PREFILL 10% NBF 15ML (MISCELLANEOUS) IMPLANT
COVER LIGHT HANDLE  1/PK (MISCELLANEOUS) ×4
COVER LIGHT HANDLE 1/PK (MISCELLANEOUS) ×2 IMPLANT
DRAIN JACKSON PRT FLT 10 (DRAIN) IMPLANT
DRAPE SHEET LG 3/4 BI-LAMINATE (DRAPES) IMPLANT
DRSG OPSITE POSTOP 4X10 (GAUZE/BANDAGES/DRESSINGS) ×3 IMPLANT
DURAPREP 26ML APPLICATOR (WOUND CARE) ×3 IMPLANT
ELECT REM PT RETURN 9FT ADLT (ELECTROSURGICAL) ×3
ELECTRODE REM PT RTRN 9FT ADLT (ELECTROSURGICAL) ×1 IMPLANT
EVACUATOR SILICONE 100CC (DRAIN) IMPLANT
EXTRACTOR VACUUM M CUP 4 TUBE (SUCTIONS) IMPLANT
EXTRACTOR VACUUM M CUP 4' TUBE (SUCTIONS)
GLOVE BIO SURGEON STRL SZ 6.5 (GLOVE) ×2 IMPLANT
GLOVE BIO SURGEONS STRL SZ 6.5 (GLOVE) ×1
GLOVE BIOGEL PI IND STRL 7.0 (GLOVE) ×1 IMPLANT
GLOVE BIOGEL PI INDICATOR 7.0 (GLOVE) ×2
GOWN STRL REUS W/TWL LRG LVL3 (GOWN DISPOSABLE) ×6 IMPLANT
HEMOSTAT SURGICEL 4X8 (HEMOSTASIS) ×2 IMPLANT
KIT ABG SYR 3ML LUER SLIP (SYRINGE) IMPLANT
NDL HYPO 25X5/8 SAFETYGLIDE (NEEDLE) IMPLANT
NEEDLE HYPO 25X5/8 SAFETYGLIDE (NEEDLE) IMPLANT
NS IRRIG 1000ML POUR BTL (IV SOLUTION) ×3 IMPLANT
PACK C SECTION WH (CUSTOM PROCEDURE TRAY) ×3 IMPLANT
PAD OB MATERNITY 4.3X12.25 (PERSONAL CARE ITEMS) ×3 IMPLANT
RTRCTR C-SECT PINK 25CM LRG (MISCELLANEOUS) IMPLANT
STRIP CLOSURE SKIN 1/2X4 (GAUZE/BANDAGES/DRESSINGS) ×2 IMPLANT
SUT CHROMIC 0 CT 1 (SUTURE) ×3 IMPLANT
SUT MNCRL AB 3-0 PS2 27 (SUTURE) ×3 IMPLANT
SUT PLAIN 2 0 (SUTURE) ×6
SUT PLAIN 2 0 XLH (SUTURE) ×3 IMPLANT
SUT PLAIN ABS 2-0 CT1 27XMFL (SUTURE) ×2 IMPLANT
SUT SILK 2 0 SH (SUTURE) IMPLANT
SUT VIC AB 0 CT1 27 (SUTURE) ×3
SUT VIC AB 0 CT1 27XBRD ANBCTR (SUTURE) IMPLANT
SUT VIC AB 0 CTX 36 (SUTURE) ×12
SUT VIC AB 0 CTX36XBRD ANBCTRL (SUTURE) ×4 IMPLANT
SUT VIC AB 2-0 CT1 27 (SUTURE) ×3
SUT VIC AB 2-0 CT1 TAPERPNT 27 (SUTURE) IMPLANT
TOWEL OR 17X24 6PK STRL BLUE (TOWEL DISPOSABLE) ×3 IMPLANT
TRAY FOLEY CATH 14FR (SET/KITS/TRAYS/PACK) ×3 IMPLANT
WATER STERILE IRR 1000ML POUR (IV SOLUTION) ×3 IMPLANT

## 2014-05-12 NOTE — Anesthesia Preprocedure Evaluation (Signed)
Anesthesia Evaluation  Patient identified by MRN, date of birth, ID band Patient awake    Reviewed: Allergy & Precautions, H&P , NPO status , Patient's Chart, lab work & pertinent test results  Airway Mallampati: II      Dental   Pulmonary asthma ,  breath sounds clear to auscultation        Cardiovascular Exercise Tolerance: Good Rhythm:regular Rate:Normal     Neuro/Psych  Headaches, PSYCHIATRIC DISORDERS Anxiety Depression    GI/Hepatic   Endo/Other    Renal/GU      Musculoskeletal   Abdominal   Peds  Hematology   Anesthesia Other Findings   Reproductive/Obstetrics (+) Pregnancy                           Anesthesia Physical Anesthesia Plan  ASA: II  Anesthesia Plan: Spinal   Post-op Pain Management:    Induction:   Airway Management Planned:   Additional Equipment:   Intra-op Plan:   Post-operative Plan:   Informed Consent: I have reviewed the patients History and Physical, chart, labs and discussed the procedure including the risks, benefits and alternatives for the proposed anesthesia with the patient or authorized representative who has indicated his/her understanding and acceptance.     Plan Discussed with: Anesthesiologist, CRNA and Surgeon  Anesthesia Plan Comments:         Anesthesia Quick Evaluation

## 2014-05-12 NOTE — Lactation Note (Signed)
This note was copied from the chart of Laura Reche DixonBrittley Jeanlouis. Lactation Consultation Note  Patient Name: [name] FAOZH'YToday's Date: 05/12/2014 Reason for consult: Initial assessment Baby 5 hours of life. Mom states that she does not want to put baby to breast. Mom has decided to pump and feed EBM with bottle. Mom has a DEBP in room. Mom has a room full of visitors, so enc mom to ask her nurse to help her start pumping soon. Enc mom to pump every 2-3 hours, with a longer stretch for sleeping tonight of 4-5 hours. Enc mom to ask for help as needed. [facility] given [facility] broch[facility], aware of OP and BF help phone line.   Maternal Data    Feeding    LATCH Score/Interventions                      Lactation Tools Discussed/Used     Consult Status Consult Status: PRN    Geralynn OchsWILLIARD, Taliah Porche 05/12/2014, 5:31 PM

## 2014-05-12 NOTE — Transfer of Care (Signed)
Immediate Anesthesia Transfer of Care Note  Patient: [name]  Procedure(s) Performed: Procedure(s): CESAREAN SECTION (N/A)  Patient Location: PACU  Anesthesia Type:Spinal  Level of Consciousness: awake, alert  and oriented  Airway & Oxygen Therapy: Patient Spontanous Breathing  Post-op Assessment: Report given to PACU RN and Post -op Vital signs reviewed and stable  Post vital signs: Reviewed and stable  Complications: No apparent anesthesia complications

## 2014-05-12 NOTE — Anesthesia Procedure Notes (Signed)
Spinal  Patient location during procedure: OR Start time: 05/12/2014 11:49 AM Staffing Anesthesiologist: Brayton CavesJACKSON, Dajai Wahlert Performed by: anesthesiologist  Preanesthetic Checklist Completed: patient identified, site marked, surgical consent, pre-op evaluation, timeout performed, IV checked, risks and benefits discussed and monitors and equipment checked Spinal Block Patient position: sitting Prep: DuraPrep Patient monitoring: heart rate, cardiac monitor, continuous pulse ox and blood pressure Approach: midline Location: L3-4 Injection technique: single-shot Needle Needle type: Sprotte  Needle gauge: 24 G Needle length: 9 cm Assessment Sensory level: T4 Additional Notes Patient identified.  Risk benefits discussed including failed block, incomplete pain control, headache, nerve damage, paralysis, blood pressure changes, nausea, vomiting, reactions to medication both toxic or allergic, and postpartum back pain.  Patient expressed understanding and wished to proceed.  All questions were answered.  Sterile technique used throughout procedure.  CSF was clear.  No parasthesia or other complications.  Please see nursing notes for vital signs.

## 2014-05-12 NOTE — Anesthesia Postprocedure Evaluation (Signed)
  Anesthesia Post-op Note  Anesthesia Post Note  Patient: [name]  Procedure(s) Performed: Procedure(s) (LRB): CESAREAN SECTION (N/A)  Anesthesia type: Spinal  Patient location: PACU  Post pain: Pain level controlled  Post assessment: Post-op Vital signs reviewed  Last Vitals:  Filed Vitals:   05/12/14 1451  BP: 128/75  Pulse: 71  Temp: 36.4 C  Resp: 18    Post vital signs: Reviewed  Level of consciousness: awake  Complications: No apparent anesthesia complications

## 2014-05-13 ENCOUNTER — Encounter (HOSPITAL_COMMUNITY): Payer: Self-pay | Admitting: Obstetrics and Gynecology

## 2014-05-13 LAB — CBC
HEMATOCRIT: 27.7 % — AB (ref 36.0–46.0)
Hemoglobin: 9.2 g/dL — ABNORMAL LOW (ref 12.0–15.0)
MCH: 25.7 pg — ABNORMAL LOW (ref 26.0–34.0)
MCHC: 33.2 g/dL (ref 30.0–36.0)
MCV: 77.4 fL — ABNORMAL LOW (ref 78.0–100.0)
Platelets: 125 10*3/uL — ABNORMAL LOW (ref 150–400)
RBC: 3.58 MIL/uL — ABNORMAL LOW (ref 3.87–5.11)
RDW: 15.7 % — ABNORMAL HIGH (ref 11.5–15.5)
WBC: 10.9 10*3/uL — ABNORMAL HIGH (ref 4.0–10.5)

## 2014-05-13 NOTE — Progress Notes (Addendum)
Subjective: Postpartum Day 1: Cesarean Delivery due to previous C/S x 2 Foley still in place, but has ambulated to BR without difficulty Feeding:  Breast Contraceptive plan:  Mirena  Objective: Vital signs in last 24 hours: Temp:  [97.1 F (36.2 C)-98.4 F (36.9 C)] 98.1 F (36.7 C) (09/30 0503) Pulse Rate:  [64-118] 68 (09/30 0503) Resp:  [14-24] 16 (09/30 0503) BP: (107-139)/(49-82) 120/75 mmHg (09/30 0503) SpO2:  [91 %-100 %] 96 % (09/30 0503) Weight:  [180 lb (81.647 kg)] 180 lb (81.647 kg) (09/29 1330)  Physical Exam:  General: alert Lochia: appropriate Uterine Fundus: firm Abdomen:  + bowel sounds, mild gaseous distension Incision: Honeycomb dressing with 2 sites of old drainage--new dressing placed, no active bleeding noted, steri-strips intact. DVT Evaluation: No evidence of DVT seen on physical exam. Negative Homan's sign. Foley draining clear urine   Recent Labs  05/11/14 1055 05/13/14 0620  HGB 11.1* 9.2*  HCT 33.0* 27.7*  WBC 8.1 10.9*  Platelets 125 (142 pre-op)  Assessment/Plan: Status post Cesarean section day 1--repeat x 3 ITP--stable Doing well postoperatively.  Continue current care. Plans outpatient circumcision     Nigel BridgemanLATHAM, VICKI CNM, MN 05/13/2014, 7:49 AM  I saw and examined patient at bedside and agree with above findings, assessment and plan as outlined by Nigel BridgemanVicki Latham, CNM.  Patient receiving iron for anemia. Ambulating, eating and voiding without difficulty. Dr. Sallye OberKulwa.

## 2014-05-13 NOTE — Anesthesia Postprocedure Evaluation (Signed)
Anesthesia Post Note  Patient: [name]  Procedure(s) Performed: Procedure(s) (LRB): CESAREAN SECTION (N/A)  Anesthesia type: Spinal  Patient location: Mother/Baby  Post pain: Pain level controlled  Post assessment: Post-op Vital signs reviewed  Last Vitals:  Filed Vitals:   05/13/14 0503  BP: 120/75  Pulse: 68  Temp: 36.7 C  Resp: 16    Post vital signs: Reviewed  Level of consciousness: awake  Complications: No apparent anesthesia complications

## 2014-05-13 NOTE — Progress Notes (Signed)
UR chart review completed.  

## 2014-05-13 NOTE — Addendum Note (Signed)
Addendum created 05/13/14 0739 by Graciela HusbandsWynn O Nashly Olsson, CRNA   Modules edited: Notes Section   Notes Section:  File: [id number]
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
## 2014-05-13 NOTE — Progress Notes (Signed)
Subjective: Postpartum Day 2: Cesarean Delivery due to repeat Patient up ad lib, reports no syncope or dizziness. +Flatus Feeding:  breast Contraceptive plan:  mirena  Objective: Vital signs in last 24 hours: Temp:  [97.9 F (36.6 C)-98.4 F (36.9 C)] 97.9 F (36.6 C) (09/30 1259) Pulse Rate:  [67-86] 67 (09/30 1259) Resp:  [16-20] 20 (09/30 1259) BP: (113-131)/(62-76) 131/66 mmHg (09/30 1259) SpO2:  [91 %-100 %] 100 % (09/30 1259)  Physical Exam:  General: alert and cooperative Lochia: appropriate Uterine Fundus: firm Abdomen:  + bowel sounds, non distended Incision: no significant drainage  Honeycomb dressing CDI DVT Evaluation: No evidence of DVT seen on physical exam. Homan's sign: Negative   Recent Labs  05/11/14 1055 05/13/14 0620  HGB 11.1* 9.2*  HCT 33.0* 27.7*  WBC 8.1 10.9*    Assessment: Status post Cesarean section day 2. Doing well postoperatively.  Honeycomb dressing in place, no significant drainage Anemia - hemodynamicly stable.    Plan: Continue current care. Plan for discharge tomorrow and Breastfeeding    Jamayah Myszka, CNM, MSN 05/13/2014. 6:42 PM

## 2014-05-13 NOTE — Discharge Summary (Signed)
Cesarean Section Delivery Discharge Summary  [name]  DOB:    02-10-1986 MRN:    [id number] CSN:    [id number]  Date of admission:                  05/12/14  Date of discharge:                   05/15/14  Procedures this admission:  Repeat LTCS  Date of Delivery: 05/12/14  Newborn Data:  Live born female  Birth Weight: 6 lb 11.4 oz (3045 g) APGAR: 7, 9  Home with mother. Name: [name] Circumcision Plan: Outpatient  History of Present Illness:  Ms. [name] is a 28 y.o. female, [phone], who presents at 6053w0d weeks gestation. [facility]    Her pregnancy has been complicated by:  Patient Active Problem List   Diagnosis Date Noted  . Status post repeat low transverse cesarean section x 2 08/31/2011  . Asthma 07/18/2011  . Sickle cell trait 07/18/2011  . 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   She was taken to the operating room, where Dr. Normand Sloopillard performed a repeat LTCS under spinal anesthesia, with delivery of a viable female, with weight and Apgars as listed below. Infant was in good condition and remained at the patient's bedside.  The patient was taken to recovery in good condition.  Patient planned to breastfeed.  On post-op day 1, patient was doing well, tolerating a regular diet, with Hgb of 9.2 (down from 11.1).  Platelet count 125 on day 1, down from 142 pre-delivery.  Throughout her stay, her physical exam was WNL, her incision was CDI, and her vital signs remained stable.  By post-op day 3, she was up ad lib, tolerating a regular diet, with good pain control with po med.  She was deemed to have received the full benefit of her hospital stay, and was discharged home in stable condition.  Contraceptive choice was Mirena.   She was noting fullness in her ears on day 2--Claritin given with benefit, sent home with Rx for same for prn use.  Feeding:  breast  Contraception:  IUD  Discharge hemoglobin:  HGB  Date Value Ref Range Status  04/22/2013 8.9* 11.6 - 15.9 g/dL Final  66/44/034711/29/2012 8.3* 11.6 - 15.9 g/dL Final     Hemoglobin  Date Value Ref Range Status  05/13/2014 9.2* 12.0 - 15.0 g/dL Final     REPEATED TO VERIFY     DELTA CHECK NOTED  05/11/2014 11.1* 12.0 - 15.0 g/dL Final  4/25/95636/29/2015 87.511.3* 12.0 - 15.0 g/dL Final     HCT  Date Value Ref Range Status  05/13/2014 27.7* 36.0 - 46.0 % Final  05/11/2014 33.0* 36.0 - 46.0 % Final  02/09/2014 34.1* 36.0 - 46.0 % Final  04/22/2013 29.2* 34.8 - 46.6 % Final  07/13/2011 25.7* 34.8 - 46.6 % Final     WBC  Date Value Ref Range Status  05/13/2014 10.9* 4.0 - 10.5 K/uL Final  05/11/2014 8.1  4.0 - 10.5 K/uL Final  02/09/2014 7.6  4.0 - 10.5 K/uL Final  04/22/2013 6.5  3.9 - 10.3 10e3/uL Final  07/13/2011 8.8  3.9 - 10.3 10e3/uL Final    Discharge Physical Exam:   General: alert Lochia: appropriate Uterine Fundus: firm Abdomen:  +  bowel sounds, NT Incision: Honeycomb dressing CDI DVT Evaluation: No evidence of DVT seen on physical exam. Negative Homan's sign.  Intrapartum Procedures: cesarean: low cervical, transverse, repeat x 3 Postpartum Procedures: none Complications-Operative and Postpartum: none  Discharge Diagnoses: Term Pregnancy-delivered and previous cesarean x 2, desired repeat, ITP  Discharge Information:  Activity:           pelvic rest Diet:                routine Medications: Ibuprofen and Percocet, Claritin Condition:      stable Instructions:  Discharge to: home  Follow-up Information   Follow up with [facility] In 5 weeks. (Call for any questions or concerns.)    Specialty:  Obstetrics and Gynecology   Contact information:   [address] [place]  [address] [phone]       Nigel Bridgeman [facility] 05/15/2014 8:28 AM

## 2014-05-14 ENCOUNTER — Encounter (HOSPITAL_COMMUNITY): Payer: Self-pay

## 2014-05-14 MED ORDER — PSEUDOEPHEDRINE HCL 30 MG PO TABS
60.0000 mg | ORAL_TABLET | Freq: Once | ORAL | Status: DC
  Filled 2014-05-14: qty 2

## 2014-05-14 MED ORDER — LORATADINE 10 MG PO TABS
10.0000 mg | ORAL_TABLET | Freq: Every day | ORAL | Status: DC
  Administered 2014-05-14: 10 mg via ORAL
  Filled 2014-05-14 (×2): qty 1

## 2014-05-14 NOTE — Progress Notes (Signed)
Called by RN due to patient feeling "ears are full" and sounds are reverberating.  Denies HA, visual sx, nausea, or vomiting.  Mild dizziness per patient report, but no true vertigo sx.  Filed Vitals:   05/13/14 0900 05/13/14 1259 05/14/14 0638 05/14/14 0800  BP: 124/72 131/66 117/64   Pulse: 74 67 79   Temp: 97.9 F (36.6 C) 97.9 F (36.6 C) 98.5 F (36.9 C)   TempSrc: Oral Oral Oral   Resp: 20 20 17    Height:    5\' 4"  (1.626 m)  Weight:    157 lb (71.215 kg)  SpO2: 100% 100% 99%    Ears--clear Throat--clear Heart RRR without murmur Abd soft, NT Lochia WNL Ext WNL Incision Honeycomb dressing CDI.  Will give Claritin to address possible fluid in inner ear (patient declines Sudafed).. Will CTO.  Nigel BridgemanVicki Anastasios Melander, CNM 05/14/14 7p

## 2014-05-14 NOTE — Progress Notes (Signed)
Pt. Complaining of feeling a "thunderstorm" in her ears; Notified CNM and received orders.  Will continue to monitor.

## 2014-05-14 NOTE — Progress Notes (Addendum)
Subjective: Postpartum Day 2: Cesarean Delivery due to previous C/S x 2 Patient up ad lib, reports no syncope or dizziness. Feeding:  Pumping, feeding via bottle Contraceptive plan:  Miorena  Objective: Vital signs in last 24 hours: Temp:  [97.9 F (36.6 C)-98.5 F (36.9 C)] 98.5 F (36.9 C) (10/01 [id number]) Pulse Rate:  [67-79] 79 (10/01 0638) Resp:  [17-20] 17 (10/01 [id number]) BP: (117-131)/(64-66) 117/64 mmHg (10/01 0638) SpO2:  [99 %-100 %] 99 % (10/01 [id number]) Weight:  [157 lb (71.215 kg)] 157 lb (71.215 kg) (10/01 0800)  Physical Exam:  General: alert Lochia: appropriate Uterine Fundus: firm Abdomen:  + bowel sounds, no distension Incision: Honeycomb dressing CDI DVT Evaluation: No evidence of DVT seen on physical exam. Negative Homan's sign.   Recent Labs  05/11/14 1055 05/13/14 0620  HGB 11.1* 9.2*  HCT 33.0* 27.7*  WBC 8.1 10.9*    Assessment/Plan: Status post Cesarean section day 2. Doing well postoperatively.  Continue current care. Plan for discharge tomorrow    Nigel BridgemanLATHAM, Kailen Hinkle CNM, MN 05/14/2014, 9:25 AM

## 2014-05-14 NOTE — Op Note (Signed)
Cesarean Section Procedure Note   [name]  05/12/2014  Indications: Scheduled Proceedure/Maternal Request   Pre-operative Diagnosis: Prior Cesarean Section.   Post-operative Diagnosis: Same   Surgeon: Surgeon(s) and Role:    * Kaili Castille A Joli Koob, MD - Primary   Assistants: Venus Standard CNM   Anesthesia: spinal   Procedure Details:  The patient was seen in the Holding Room. The risks, benefits, complications, treatment options, and expected outcomes were discussed with the patient. The patient concurred with the proposed plan, giving informed consent. identified as [name] and the procedure verified as C-Section Delivery. A Time Out was held and the above information confirmed.  After induction of anesthesia, the patient was draped and prepped in the usual sterile manner. A transverse incision was made and carried down through the subcutaneous tissue to the fascia. Fascial incision was made in the midline and extended transversely. The fascia was separated from the underlying rectus muscle superiorly and inferiorly. The peritoneum was identified and entered. Peritoneal incision was extended longitudinally with good visualization of bowel and bladder. The utero-vesical peritoneal reflection was incised transversely and the bladder flap was bluntly freed from the lower uterine segment.  An alexsis retractor was placed in the abdomen.   A low transverse uterine incision was made. Delivered from cephalic presentation was a  infant, with Apgar scores of 8 at one minute and 9 at five minutes. Cord ph was not sent the umbilical cord was clamped and cut cord blood was obtained for evaluation. The placenta was removed Intact and appeared normal. The uterine outline, tubes and ovaries appeared normal}. The uterine incision was closed with running locked sutures of 0Vicryl. A second layer 0 vicrlyl was used to imbricate the uterine incision    Hemostasis was observed. Lavage was carried out  until clear. The alexsis was removed.  The peritoneum was closed with 0 chromic.  The muscles were examined and any bleeders were made hemostatic using bovie cautery device.   The fascia was then reapproximated with running sutures of 0 vicryl.  The subcutaneous tissue was reapproximated  With interrupted stitches using 2-0 plain gut. The subcuticular closure was performed using 3-0monocryl     Instrument, sponge, and needle counts were correct prior the abdominal closure and were correct at the conclusion of the case.    Findings: infant was delivered from vtx presentation. The fluid was clear.  The uterus tubes and ovaries appeared normal.     Estimated Blood Loss: 800   Total IV Fluids: <MEASUREOak Point Surgical Suites LLCBeKnox County HospitArdelle AMa<MEASUREMEAdvanced Diagnostic And Surgical Center IncBePresence [facility] Hospitals Network Dba Presence St Joseph Medical CentArdelle AMa<MEASUREMEWestwood/Pembroke Health System WestwoodBeThree Gables Surgery CentArdelle AMa<MEASUREMEGouverneur HospitalBeOne Day Surgery CentArdelle AMa<MEASUREMEFirst Surgical Woodlands LPBeMount Grant General HospitArdelle AMa<MEASUREMEMedstar Medical Group Southern [facility] LLCBeFloyd Valley HospitArdelle AMa<MEASUREMEJewish Hospital & St. Mary'S HealthcareBeMemorial Regional HospitArdelle AMa<MEASUREMEWilkes Barre Va Medical CenterBeSt Vincent Fishers Hospital IArdelle AMa<MEASUREMEOrthosouth Surgery Center Germantown LLCBeChristus Santa Rosa Hospital - Alamo HeighArdelle AMa<MEASUREMEVa N. Indiana Healthcare System - MarionBeMichiana Behavioral Health CentArdelle AMa<MEASUREMENorthern New Jersey Center For Advanced Endoscopy LLCBeMckenzie Memorial HospitArdelle AMarylaShon Batonin Street00CC OF rose colored urine.  The urine started to clear after the infant was born.  It was rose colored with placement of the foley  Specimens: placenta  Complications: no complications  Disposition: PACU - hemodynamically stable.   Maternal Condition: stable   Baby condition / location:  Couplet care / Skin to Skin  Attending Attestation: I performed the procedure.   Signed: Surgeon(s): Kateline Kinkade A Francyne Arreaga, MD

## 2014-05-14 NOTE — Lactation Note (Signed)
This note was copied from the chart of [name]. Lactation Consultation Note: Mom has been pumping and bottle feeding EBM and formula. Breasts are feeling much fuller today. Does not want to put baby to the breast. Reports she just pumped for 10 min- is bottle feeding EBM when I went into room. Asking about engorgement- reviewed prevention and treatment. Small crack noted at base of areola on right nipple. Mom has been using #24 flanges- encouraged to use # 27. Reviewed cleaning of pump parts with mom. No questions at present. To call prn. [facility]  Patient Name: [name] NWGNF'AToday's Date: 05/14/2014 Reason for consult: Follow-up assessment   Maternal Data Formula Feeding for Exclusion: Yes Reason for exclusion: Mother's choice to formula and breast feed on admission Does the patient have breastfeeding experience prior to this delivery?: Yes  Feeding Feeding Type: Breast Milk Length of feed: 30 min  LATCH Score/Interventions                      Lactation Tools Discussed/Used     Consult Status Consult Status: PRN    Pamelia HoitWeeks, Sahian Kerney D 05/14/2014, 11:31 AM

## 2014-05-15 LAB — URINE CULTURE
COLONY COUNT: NO GROWTH
CULTURE: NO GROWTH

## 2014-05-15 MED ORDER — LORATADINE 10 MG PO TABS: 10.0000 mg | ORAL_TABLET | Freq: Every day | ORAL | Status: DC

## 2014-05-15 MED ORDER — OXYCODONE-ACETAMINOPHEN 5-325 MG PO TABS: 1.0000 | ORAL_TABLET | ORAL | Status: DC | PRN

## 2014-05-15 MED ORDER — IBUPROFEN 600 MG PO TABS: 600.0000 mg | ORAL_TABLET | Freq: Four times a day (QID) | ORAL | Status: DC | PRN

## 2014-05-15 MED ORDER — LORATADINE 10 MG PO TABS: 10.0000 mg | ORAL_TABLET | Freq: Every day | ORAL | Status: DC | PRN

## 2014-05-15 NOTE — Lactation Note (Signed)
This note was copied from the chart of [name]. Lactation Consultation Note  Patient Name: [name] ZOXWR'UToday's Date: 05/15/2014 Reason for consult: Follow-up assessment Baby 71 hours of age. Mom reports that her breasts are starting to fill. Mom has decided to pump and bottle-feed EBM. [facility] has appointment Tuesday 04-19-14 with [facility]. Reviewed engorgement prevention/treatment. Referred mom to Baby and Me booklet for EBM storage guidelines. Mom aware of OP/BFSG and LC phone line assistance.   Maternal Data    Feeding Feeding Type:  (Mom pumping and bottle-feeding EBM.)  LATCH Score/Interventions                      Lactation Tools Discussed/Used Pump Review: Setup, frequency, and cleaning;Milk Storage   Consult Status Consult Status: Complete    Genesys Coggeshall 05/15/2014, 11:46 AM

## 2014-05-15 NOTE — Discharge Instructions (Signed)

## 2014-05-26 ENCOUNTER — Telehealth (HOSPITAL_COMMUNITY): Payer: Self-pay | Admitting: Lactation Services

## 2014-05-26 NOTE — Telephone Encounter (Signed)
[name] called to report that her milk supply had decreased and she had developed a fever over the last 2-3 days. She stopped putting baby to breast after she was d/c from hospital on 05/15/14 because of sore nipples. She was pumping every 3-4 hours but as of the past 2 days she has been pumping 2 times a day, once in am and once in pm. [name] reports her breasts are not as full, but she does report firmness/redness in the outer quadrants of her breast and the top areas of both breasts. LC advised [name] to call her OB to discuss if she needs antibiotics as she may be getting mastitis. Mom reports she wants to continue to provide breast milk for her baby and would like to increase her milk supply.  Plan discussed with Mom to treat possible breast infection and increase milk supply: Advised Mom that she needs to pump every 3 hours for 15-20 minutes to increase milk supply and empty breast well. Give the baby back any EBM she obtains. Due to redness/firmness, apply warm compress before pumping, massage breast with pumping, then apply ice packs after pumping. Take Motrin as prescribed by OB. Keep this routine up till breast soreness resolves. Then she needs to continue to pump every 3 hours to protect her milk supply.  Her milk supply should begin to increase once infection is resolving and with consistent pumping. OP f/u scheduled for Friday, 05/29/14 at 0900.

## 2014-05-29 ENCOUNTER — Ambulatory Visit (HOSPITAL_COMMUNITY): Admission: RE | Admit: 2014-05-29 | Payer: Medicaid Other | Source: Ambulatory Visit

## 2014-06-15 ENCOUNTER — Encounter (HOSPITAL_COMMUNITY): Payer: Self-pay

## 2014-12-29 IMAGING — CR DG CHEST 2V
2 series · 2 of 2 positions shown · non-contrast
Comparison: PA and lateral chest 03/02/2013.

CLINICAL DATA: Fever.

EXAM:
CHEST  2 VIEW

[w chest pa]
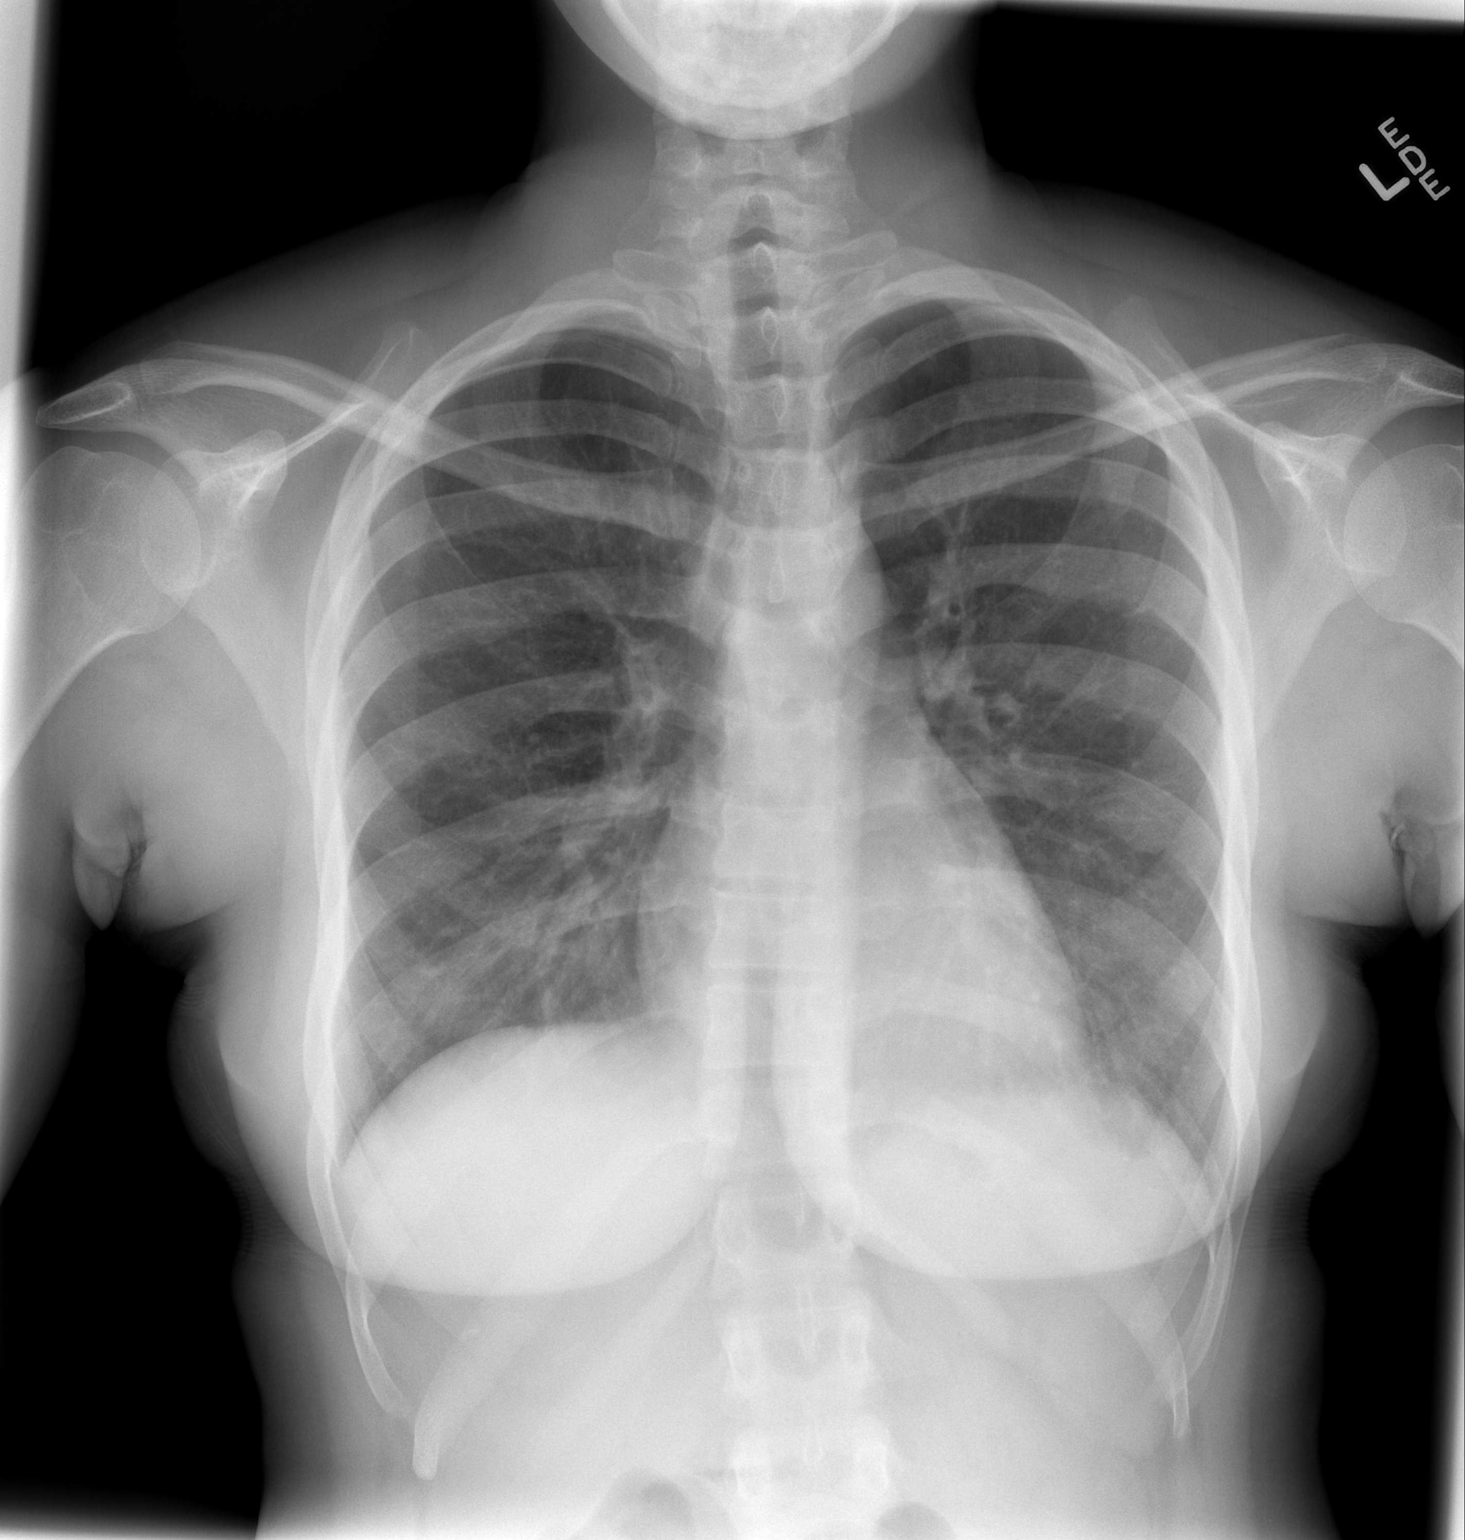

[w chest lat]
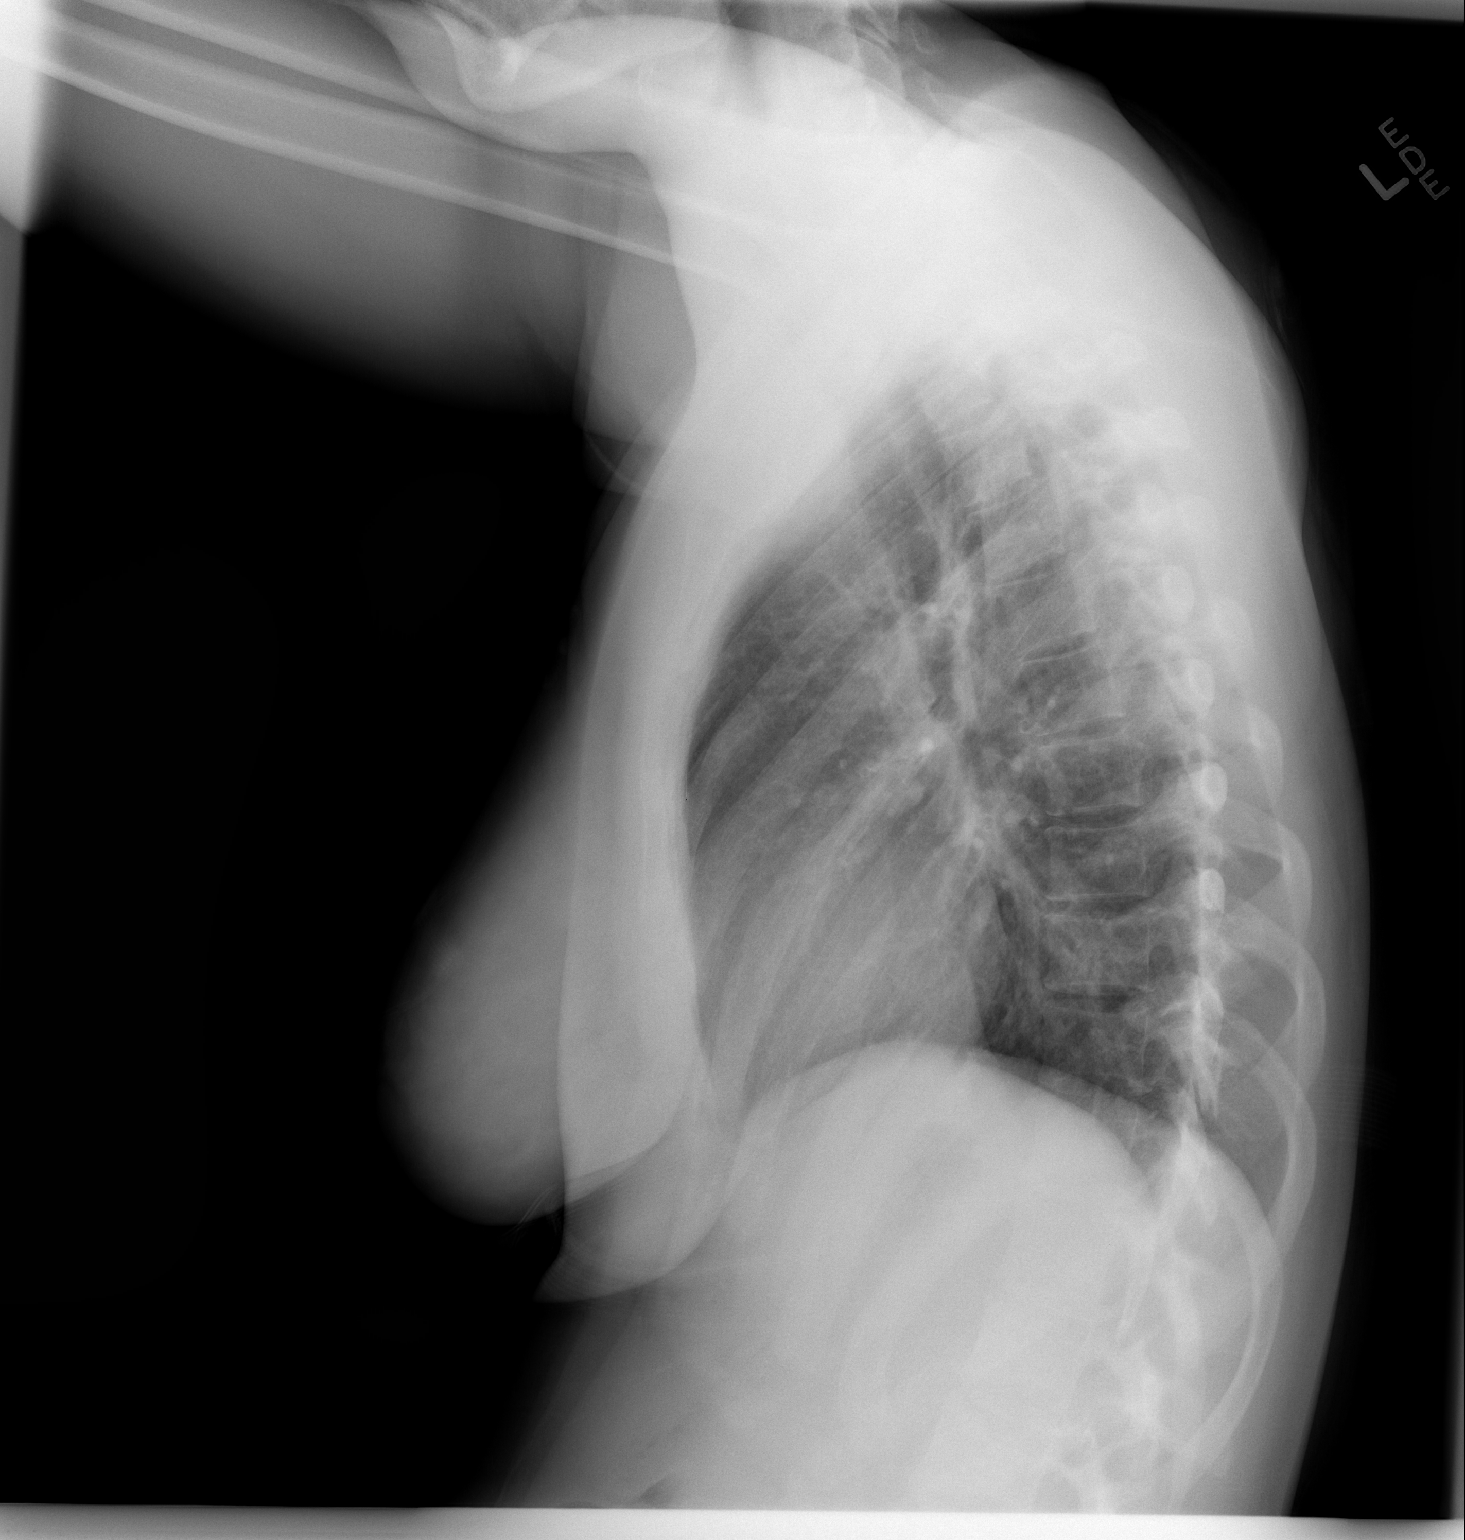

[2 of 2 positions shown; findings below may reference images not displayed]

FINDINGS: The lungs are clear. Heart size is normal. No pneumothorax or
pleural effusion. No focal bony abnormality.
IMPRESSION: No acute disease.

## 2015-02-21 IMAGING — US US OB COMP LESS 14 WK
1 series · 13 of 28 positions shown · non-contrast
Comparison: None.

CLINICAL DATA: Bleeding in early pregnancy.

EXAM:
OBSTETRIC <14 WK ULTRASOUND
TECHNIQUE: Transabdominal ultrasound was performed for evaluation of the
gestation as well as the maternal uterus and adnexal regions.

[Series 1: us ob comp less 14 wks · 49 acquisitions, 13 frames shown]
[im 2/49]
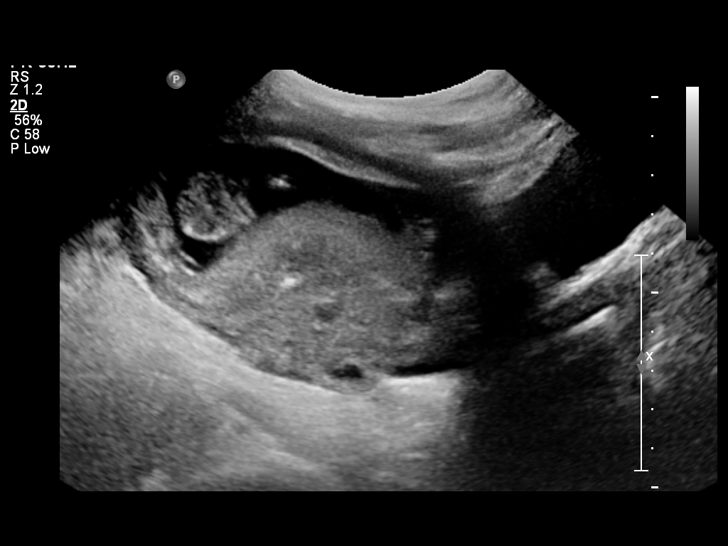
[im 6/49]
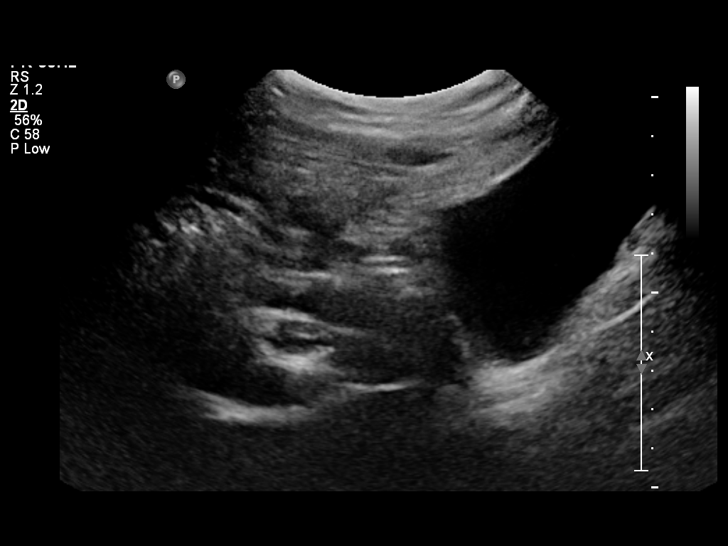
[im 9/49]
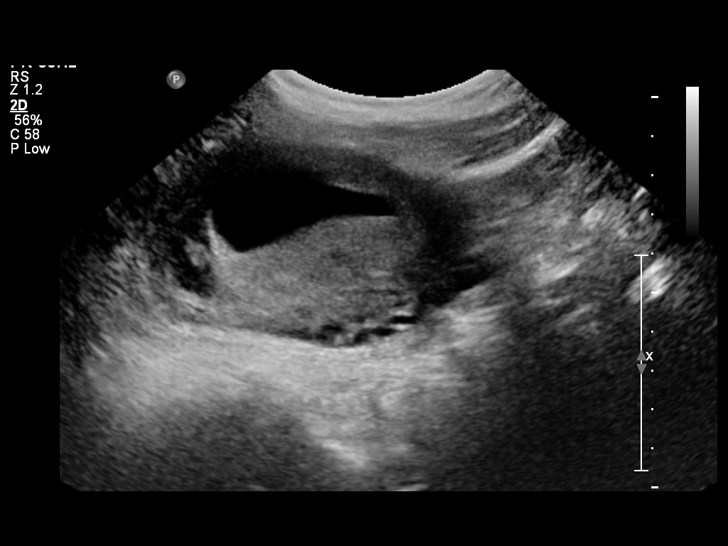
[im 13/49]
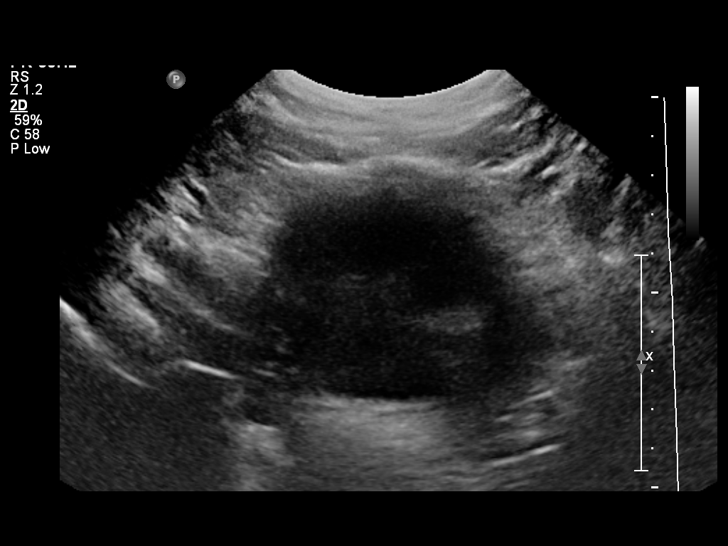
[im 17/49]
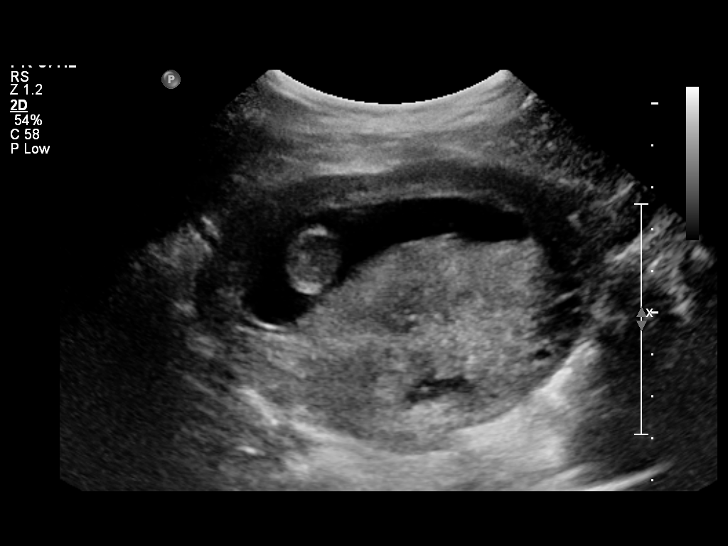
[im 20/49]
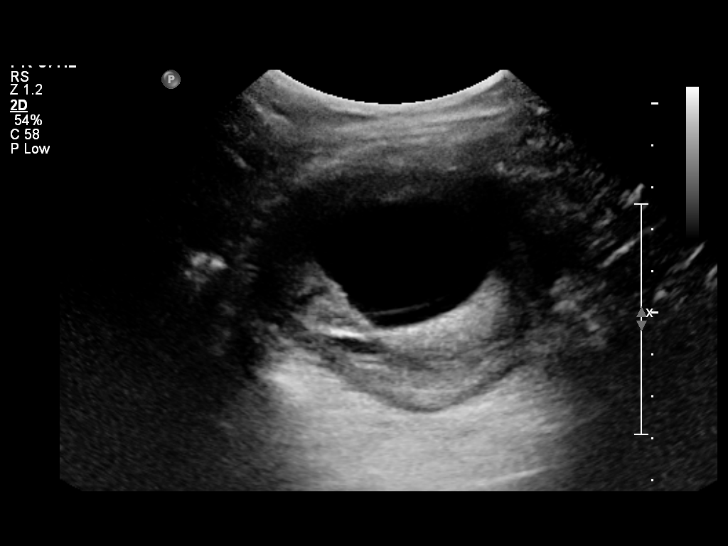
[im 25/49]
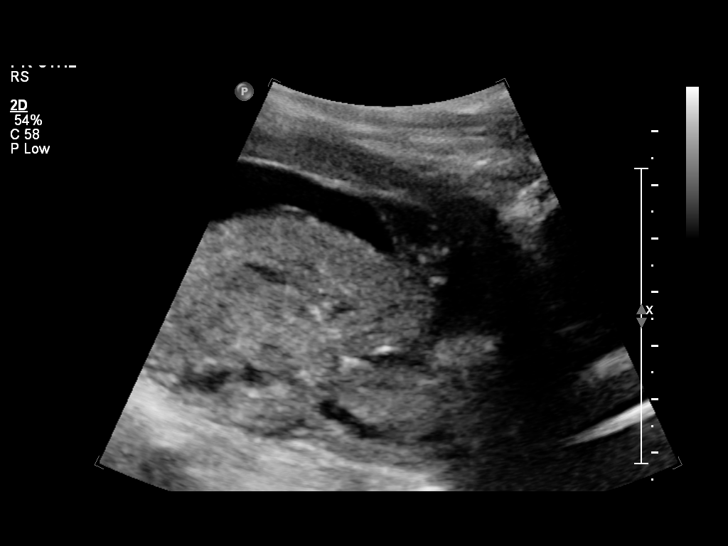
[im 29/49]
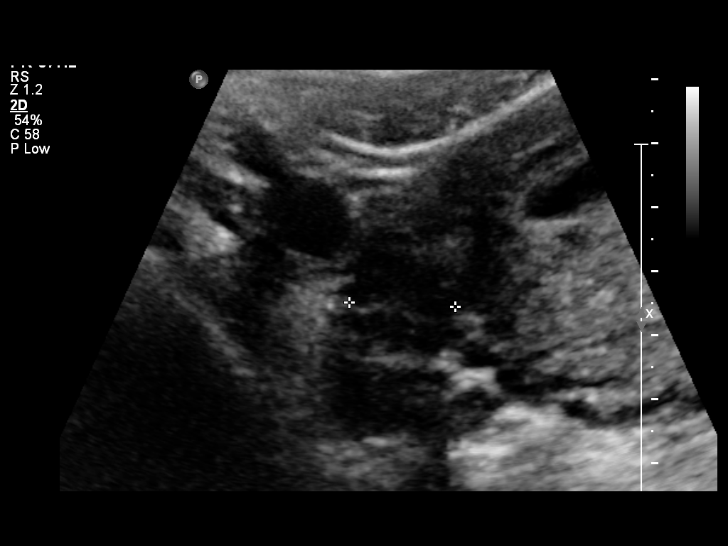
[im 33/49]
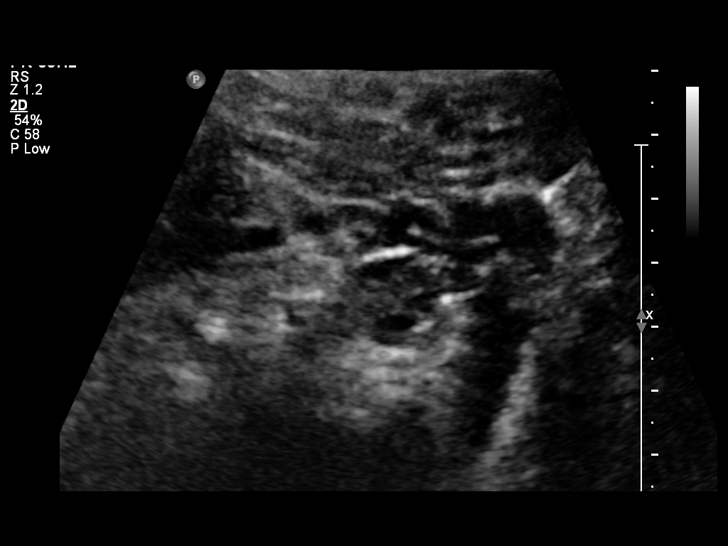
[im 36/49]
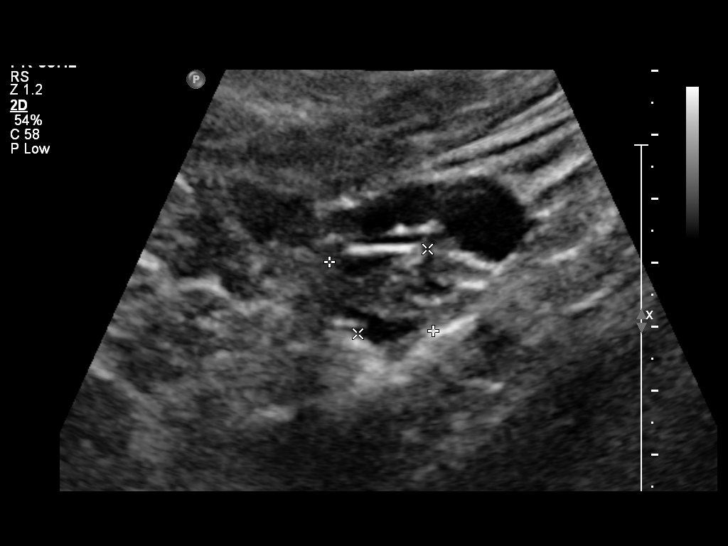
[im 40/49]
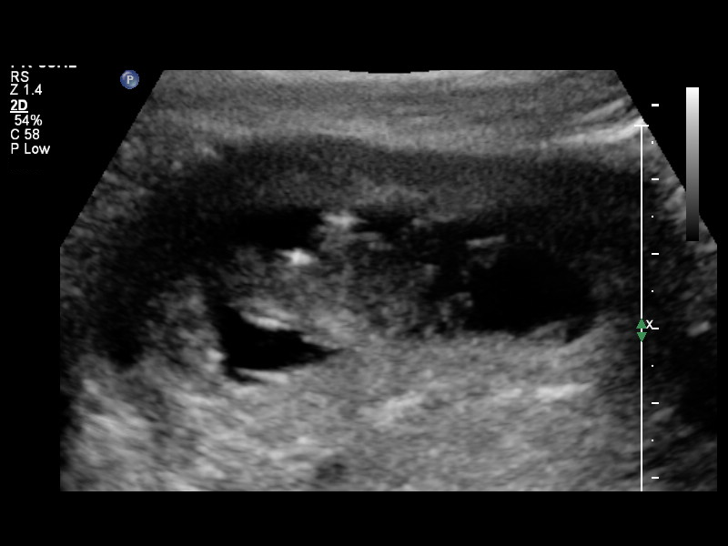
[im 43/49]
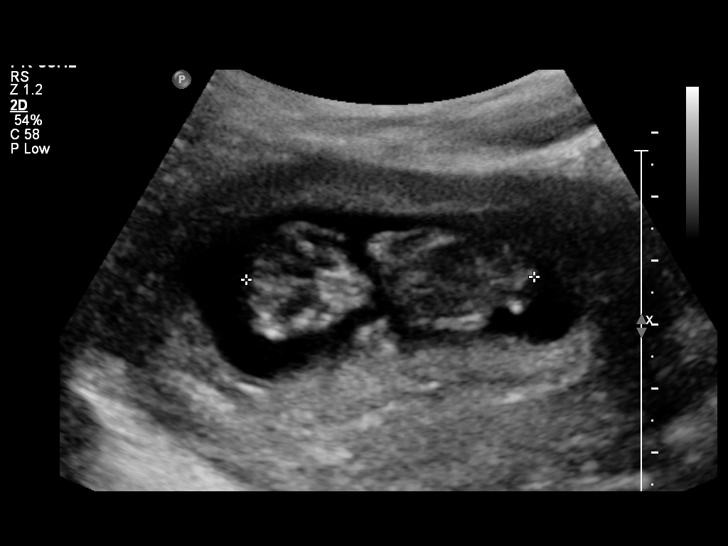
[im 47/49]
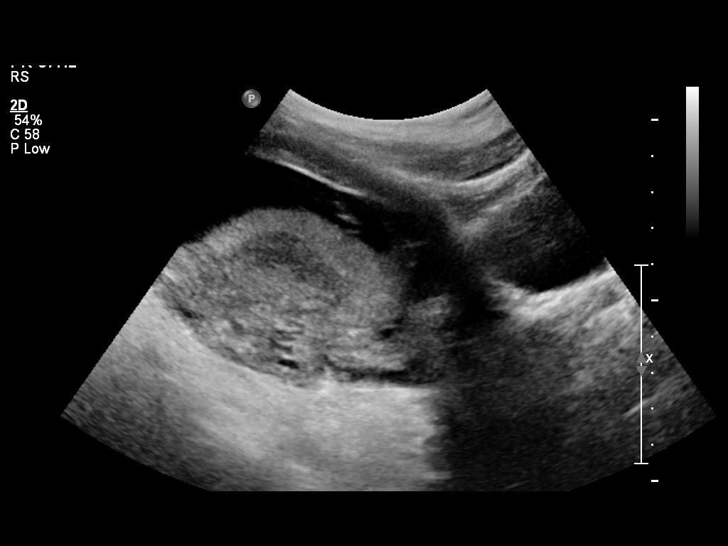

[13 of 28 positions shown; findings below may reference images not displayed]

FINDINGS: Intrauterine gestational sac: Visualized/normal in shape.

Yolk sac:  Not visualized

Embryo:  Visualize

Cardiac Activity: Visualize

Heart Rate: 124 bpm

CRL:   44  mm   11 w 2 d                  US EDC: 05/16/2014

Maternal uterus/adnexae: On some images including image 48, a
rounded 3.5 x 4.4 cm "lesion" beneath the placenta is heterogeneous
and vascular. Blood flow is shown in this vicinity on image 26. Also
on image 26 there is a suggestion of a trace amount of subchorionic
hemorrhage along the inferior placental margin.
IMPRESSION: 1. Single living intrauterine pregnancy measuring 11 weeks, 2 days
gestation.
2. Suspected small amount of subchorionic hemorrhage along the
inferior placental margin.
3. Vascular structure deep to the placenta was thought at real-time
ultrasound by the technologist to represent a contraction, although
delayed imaging to ensure resolution of this contraction and rule
out entities such as chorioangioma was not performed. This merits
attention on followup sonography. Given the internal vascularity, I
do not think that this represents a placental abruption.

## 2016-03-10 ENCOUNTER — Encounter: Payer: Self-pay | Admitting: Pediatrics

## 2016-07-12 ENCOUNTER — Encounter (HOSPITAL_COMMUNITY): Payer: Self-pay | Admitting: *Deleted

## 2016-07-12 ENCOUNTER — Inpatient Hospital Stay (HOSPITAL_COMMUNITY)
Admission: AD | Admit: 2016-07-12 | Discharge: 2016-07-13 | Disposition: A | Discharge status: Home / Self Care | Payer: Medicaid Other | Source: Ambulatory Visit | Attending: Obstetrics and Gynecology | Admitting: Obstetrics and Gynecology

## 2016-07-12 DIAGNOSIS — Z888 Allergy status to other drugs, medicaments and biological substances status: Secondary | ICD-10-CM | POA: Insufficient documentation

## 2016-07-12 DIAGNOSIS — L258 Unspecified contact dermatitis due to other agents: Secondary | ICD-10-CM

## 2016-07-12 DIAGNOSIS — N390 Urinary tract infection, site not specified: Secondary | ICD-10-CM | POA: Insufficient documentation

## 2016-07-12 DIAGNOSIS — Z9889 Other specified postprocedural states: Secondary | ICD-10-CM | POA: Insufficient documentation

## 2016-07-12 DIAGNOSIS — D573 Sickle-cell trait: Secondary | ICD-10-CM | POA: Insufficient documentation

## 2016-07-12 DIAGNOSIS — F329 Major depressive disorder, single episode, unspecified: Secondary | ICD-10-CM | POA: Insufficient documentation

## 2016-07-12 DIAGNOSIS — R51 Headache: Secondary | ICD-10-CM | POA: Insufficient documentation

## 2016-07-12 DIAGNOSIS — L259 Unspecified contact dermatitis, unspecified cause: Secondary | ICD-10-CM | POA: Insufficient documentation

## 2016-07-12 DIAGNOSIS — B373 Candidiasis of vulva and vagina: Secondary | ICD-10-CM | POA: Insufficient documentation

## 2016-07-12 DIAGNOSIS — F419 Anxiety disorder, unspecified: Secondary | ICD-10-CM | POA: Insufficient documentation

## 2016-07-12 DIAGNOSIS — L509 Urticaria, unspecified: Secondary | ICD-10-CM | POA: Insufficient documentation

## 2016-07-12 DIAGNOSIS — Z79899 Other long term (current) drug therapy: Secondary | ICD-10-CM | POA: Insufficient documentation

## 2016-07-12 DIAGNOSIS — D696 Thrombocytopenia, unspecified: Secondary | ICD-10-CM | POA: Insufficient documentation

## 2016-07-12 DIAGNOSIS — J45909 Unspecified asthma, uncomplicated: Secondary | ICD-10-CM | POA: Insufficient documentation

## 2016-07-12 NOTE — MAU Note (Signed)
Pt presents with complaint of hives that started this pm. Denies taking any meds.

## 2016-07-13 DIAGNOSIS — F419 Anxiety disorder, unspecified: Secondary | ICD-10-CM | POA: Diagnosis not present

## 2016-07-13 DIAGNOSIS — L509 Urticaria, unspecified: Secondary | ICD-10-CM

## 2016-07-13 DIAGNOSIS — N390 Urinary tract infection, site not specified: Secondary | ICD-10-CM | POA: Diagnosis not present

## 2016-07-13 DIAGNOSIS — Z9889 Other specified postprocedural states: Secondary | ICD-10-CM | POA: Diagnosis not present

## 2016-07-13 DIAGNOSIS — Z79899 Other long term (current) drug therapy: Secondary | ICD-10-CM | POA: Diagnosis not present

## 2016-07-13 DIAGNOSIS — D573 Sickle-cell trait: Secondary | ICD-10-CM | POA: Diagnosis not present

## 2016-07-13 DIAGNOSIS — J45909 Unspecified asthma, uncomplicated: Secondary | ICD-10-CM | POA: Diagnosis not present

## 2016-07-13 DIAGNOSIS — F329 Major depressive disorder, single episode, unspecified: Secondary | ICD-10-CM | POA: Diagnosis not present

## 2016-07-13 DIAGNOSIS — R51 Headache: Secondary | ICD-10-CM | POA: Diagnosis not present

## 2016-07-13 DIAGNOSIS — D696 Thrombocytopenia, unspecified: Secondary | ICD-10-CM | POA: Diagnosis not present

## 2016-07-13 DIAGNOSIS — Z888 Allergy status to other drugs, medicaments and biological substances status: Secondary | ICD-10-CM | POA: Diagnosis not present

## 2016-07-13 DIAGNOSIS — L259 Unspecified contact dermatitis, unspecified cause: Secondary | ICD-10-CM | POA: Diagnosis not present

## 2016-07-13 DIAGNOSIS — B373 Candidiasis of vulva and vagina: Secondary | ICD-10-CM | POA: Diagnosis not present

## 2016-07-13 MED ORDER — DIPHENHYDRAMINE HCL 25 MG PO CAPS
25.0000 mg | ORAL_CAPSULE | Freq: Once | ORAL | Status: AC
  Administered 2016-07-13: 25 mg via ORAL
  Filled 2016-07-13: qty 1

## 2016-07-13 NOTE — Discharge Instructions (Signed)
   Contact Dermatitis Introduction Dermatitis is redness, soreness, and swelling (inflammation) of the skin. Contact dermatitis is a reaction to certain substances that touch the skin. You either touched something that irritated your skin, or you have allergies to something you touched. Follow these instructions at home: Skin Care  Moisturize your skin as needed.  Apply cool compresses to the affected areas.  Try taking a bath with:  Epsom salts. Follow the instructions on the package. You can get these at a pharmacy or grocery store.  Baking soda. Pour a small amount into the bath as told by your doctor.  Colloidal oatmeal. Follow the instructions on the package. You can get this at a pharmacy or grocery store.  Try applying baking soda paste to your skin. Stir water into baking soda until it looks like paste.  Do not scratch your skin.  Bathe less often.  Bathe in lukewarm water. Avoid using hot water. Medicines  Take or apply over-the-counter and prescription medicines only as told by your doctor.  If you were prescribed an antibiotic medicine, take or apply your antibiotic as told by your doctor. Do not stop taking the antibiotic even if your condition starts to get better. General instructions  Keep all follow-up visits as told by your doctor. This is important.  Avoid the substance that caused your reaction. If you do not know what caused it, keep a journal to try to track what caused it. Write down:  What you eat.  What cosmetic products you use.  What you drink.  What you wear in the affected area. This includes jewelry.  If you were given a bandage (dressing), take care of it as told by your doctor. This includes when to change and remove it. Contact a doctor if:  You do not get better with treatment.  Your condition gets worse.  You have signs of infection such as:  Swelling.  Tenderness.  Redness.  Soreness.  Warmth.  You have a fever.  You  have new symptoms. Get help right away if:  You have a very bad headache.  You have neck pain.  Your neck is stiff.  You throw up (vomit).  You feel very sleepy.  You see red streaks coming from the affected area.  Your bone or joint underneath the affected area becomes painful after the skin has healed.  The affected area turns darker.  You have trouble breathing. This information is not intended to replace advice given to you by your health care provider. Make sure you discuss any questions you have with your health care provider. Document Released: 05/28/2009 Document Revised: 01/06/2016 Document Reviewed: 12/16/2014  2017 Elsevier  

## 2016-07-13 NOTE — MAU Provider Note (Signed)
History     CSN: [id number]  Arrival date and time: 07/12/16 2307   First Provider Initiated Contact with Patient 07/12/16 2359      Chief Complaint  Patient presents with  . Urticaria   Urticaria  This is a new problem. The current episode started today. The problem is unchanged. The affected locations include the left upper leg, right lower leg, left arm, right arm and back. The rash is characterized by redness and swelling. She was exposed to a new detergent/soap. Pertinent negatives include no fever. Past treatments include nothing.   Past Medical History:  Diagnosis Date  . Abnormal Pap smear    HISTORY  . Anxiety    NO MED  . Asthma    AS A CHILD - NO INHALER-NO PROBLEMS  . Bacterial vaginosis    HISTORY  . Chlamydia    HISTORY  . Depression    NO MED  . Headache(784.0)    OTC MED PRN  . HPV (human papilloma virus) infection   . Missed abortion    X 2  . Preterm labor    HISTORY WITH FIRST PREGNANCY  . Sickle cell trait (HCC)   . Termination of pregnancy (fetus)    X 1  . Thrombocytopenia (HCC)   . UTI (urinary tract infection)    HISTORY  . Yeast vaginitis    HISTORY    Past Surgical History:  Procedure Laterality Date  . CESAREAN SECTION  02/2009  . CESAREAN SECTION  08/28/2011   Procedure: CESAREAN SECTION;  Surgeon: Esmeralda ArthurSandra A Rivard, MD;  Location: [facility];  Service: Gynecology;  Laterality: N/A;  . CESAREAN SECTION N/A 05/12/2014   Procedure: CESAREAN SECTION;  Surgeon: Michael LitterNaima A Dillard, MD;  Location: [facility];  Service: Obstetrics;  Laterality: N/A;  . EYE SURGERY     CLOGGED DUCT - LEFT  . EYE SURGERY    . TOOTH EXTRACTION      Family History  Problem Relation Age of Onset  . Cancer Paternal Uncle   . Cancer Maternal Grandmother   . Cancer Paternal Grandfather   . Anesthesia problems Neg Hx     Social History  Substance Use Topics  . Smoking status: Never Smoker  . Smokeless tobacco: Never Used  . Alcohol use No     Comment: none with  preg    Allergies:  Allergies  Allergen Reactions  . Sulfa Antibiotics Hives and Itching    Prescriptions Prior to Admission  Medication Sig Dispense Refill Last Dose  . ibuprofen (ADVIL,MOTRIN) 600 MG tablet Take 1 tablet (600 mg total) by mouth every 6 (six) hours as needed for mild pain. 36 tablet 2   . loratadine (CLARITIN) 10 MG tablet Take 1 tablet (10 mg total) by mouth daily as needed for allergies (Use daily for next 2-3 days, then as needed). 30 tablet 2   . oxyCODONE-acetaminophen (PERCOCET/ROXICET) 5-325 MG per tablet Take 1 tablet by mouth every 4 (four) hours as needed (for pain scale less than 7). 30 tablet 0   . Prenatal Vit-Fe Fumarate-FA (PRENATAL MULTIVITAMIN) TABS tablet Take 1 tablet by mouth daily at 12 noon.   05/08/2014 at Unknown time    Review of Systems  Constitutional: Negative for chills and fever.  Gastrointestinal: Negative for abdominal pain.  Skin: Positive for itching and rash.   Physical Exam   Blood pressure 116/80, pulse 74, temperature 98.4 F (36.9 C), temperature source Oral, resp. rate 16, last menstrual period 07/02/2016, SpO2 100 %,  unknown if currently breastfeeding.  Physical Exam  Nursing note and vitals reviewed. Constitutional: She is oriented to person, place, and time. She appears well-developed and well-nourished. No distress.  Cardiovascular: Normal rate.   Respiratory: Effort normal.  GI: Soft. There is no tenderness. There is no rebound.  Neurological: She is alert and oriented to person, place, and time.  Skin: Skin is warm and dry. Rash noted.  Diffuse hives on arms, legs and back.   Psychiatric: She has a normal mood and affect.    MAU Course  Procedures  MDM  Patient has had benadryl  Assessment and Plan   1. Hives   2. Contact dermatitis due to soap    DC home Comfort measures reviewed  RX: none. May take benadryl OTC as needed  Return to MAU as needed   Follow-up Information    [facility].   Specialty:  Obstetrics and Gynecology Contact information: [address] [place] [id number] [phone]           Tawnya CrookHogan, Heather Donovan 07/13/2016, 12:01 AM

## 2019-12-10 ENCOUNTER — Other Ambulatory Visit: Payer: Self-pay | Admitting: Obstetrics and Gynecology

## 2019-12-10 DIAGNOSIS — N939 Abnormal uterine and vaginal bleeding, unspecified: Secondary | ICD-10-CM

## 2019-12-22 ENCOUNTER — Ambulatory Visit
Admission: RE | Admit: 2019-12-22 | Discharge: 2019-12-22 | Disposition: A | Discharge status: Home / Self Care | Payer: Medicaid Other | Source: Ambulatory Visit | Attending: Obstetrics and Gynecology | Admitting: Obstetrics and Gynecology

## 2019-12-22 DIAGNOSIS — N939 Abnormal uterine and vaginal bleeding, unspecified: Secondary | ICD-10-CM

## 2022-08-04 ENCOUNTER — Encounter: Payer: Self-pay | Admitting: Oncology

## 2022-08-11 ENCOUNTER — Ambulatory Visit (INDEPENDENT_AMBULATORY_CARE_PROVIDER_SITE_OTHER): Payer: Medicaid Other | Admitting: Internal Medicine

## 2022-08-11 ENCOUNTER — Other Ambulatory Visit: Payer: Self-pay

## 2022-08-11 ENCOUNTER — Encounter: Payer: Self-pay | Admitting: Internal Medicine

## 2022-08-11 VITALS — BP 104/68 | HR 64 | Temp 98.0°F | Resp 16 | Ht 66.0 in | Wt 220.0 lb

## 2022-08-11 DIAGNOSIS — J3089 Other allergic rhinitis: Secondary | ICD-10-CM

## 2022-08-11 DIAGNOSIS — H1013 Acute atopic conjunctivitis, bilateral: Secondary | ICD-10-CM

## 2022-08-11 DIAGNOSIS — J302 Other seasonal allergic rhinitis: Secondary | ICD-10-CM

## 2022-08-11 MED ORDER — AZELASTINE HCL 0.1 % NA SOLN: 1.0000 | Freq: Two times a day (BID) | NASAL | 5 refills | Status: DC

## 2022-08-11 MED ORDER — CETIRIZINE HCL 10 MG PO TABS: 10.0000 mg | ORAL_TABLET | Freq: Every day | ORAL | 5 refills | Status: DC

## 2022-08-11 MED ORDER — OLOPATADINE HCL 0.2 % OP SOLN: 1.0000 [drp] | Freq: Every day | OPHTHALMIC | 5 refills | Status: DC | PRN

## 2022-08-11 MED ORDER — FLUTICASONE PROPIONATE 50 MCG/ACT NA SUSP: 2.0000 | Freq: Every day | NASAL | 5 refills | Status: DC

## 2022-08-11 NOTE — Patient Instructions (Addendum)
Allergic Rhinitis Allergic Conjunctivitis  - Positive skin test 07/2022: trees, grasses, weeds, dust mite, cat, cockroach.   - Avoidance measures discussed. - Use nasal saline rinses before nose sprays such as with Neilmed Sinus Rinse.  Use distilled water.   - Use Flonase 2 sprays each nostril daily. Aim upward and outward. - Use Azelastine 1-2 sprays each nostril twice daily as needed for runny nose, congestion, sneezing. Aim upward and outward. - Use Zyrtec 10 mg daily or Xyzal 5mg  daily.    - For eyes, use Olopatadine or Ketotifen 1 eye drop daily as needed for itchy, watery eyes.  Available over the counter, if not covered by insurance.  - Consider allergy shots as long term control of your symptoms by teaching your immune system to be more tolerant of your allergy triggers  ALLERGEN AVOIDANCE MEASURES   Dust Mites Use central air conditioning and heat; and change the filter monthly.  Pleated filters work better than mesh filters.  Electrostatic filters may also be used; wash the filter monthly.  Window air conditioners may be used, but do not clean the air as well as a central air conditioner.  Change or wash the filter monthly. Keep windows closed.  Do not use attic fans.   Encase the mattress, box springs and pillows with zippered, dust proof covers. Wash the bed linens in hot water weekly.   Remove carpet, especially from the bedroom. Remove stuffed animals, throw pillows, dust ruffles, heavy drapes and other items that collect dust from the bedroom. Do not use a humidifier.   Use wood, vinyl or leather furniture instead of cloth furniture in the bedroom. Keep the indoor humidity at 30 - 40%.  Monitor with a humidity gauge.  Cockroach Limit spread of food around the house; especially keep food out of bedrooms. Keep food and garbage in closed containers with a tight lid.  Never leave food out in the kitchen.  Do not leave out pet food or dirty food bowls. Mop the kitchen floor  and wash countertops at least once a week. Repair leaky pipes and faucets so there is no standing water to attract roaches. Plug up cracks in the house through which cockroaches can enter. Use bait stations and approved pesticides to reduce cockroach infestation. Pollen Avoidance Pollen levels are highest during the mid-day and afternoon.  Consider this when planning outdoor activities. Avoid being outside when the grass is being mowed, or wear a mask if the pollen-allergic person must be the one to mow the grass. Keep the windows closed to keep pollen outside of the home. Use an air conditioner to filter the air. Take a shower, wash hair, and change clothing after working or playing outdoors during pollen season. Pet Dander Keep the pet out of your bedroom and restrict it to only a few rooms. Be advised that keeping the pet in only one room will not limit the allergens to that room. Don't pet, hug or kiss the pet; if you do, wash your hands with soap and water. High-efficiency particulate air (HEPA) cleaners run continuously in a bedroom or living room can reduce allergen levels over time. Regular use of a high-efficiency vacuum cleaner or a central vacuum can reduce allergen levels. Giving your pet a bath at least once a week can reduce airborne allergen.

## 2022-08-11 NOTE — Progress Notes (Addendum)
NEW PATIENT  Date of Service/Encounter:  08/11/22  Consult requested by: Patient, No Pcp Per   Subjective:   [name] (DOB: September 21, 1985) is a 36 y.o. female who presents to the clinic on 08/11/2022 with a chief complaint of Allergies and Nasal Congestion .    History obtained from: chart review and patient.   Asthma:  Has a history of asthma and used to require albuterol inhaler and nebulizer when she was very little.  She was never on any maintenance inhalers.  Last use of inhalers was in high school.  She thinks she has outgrown this and does not require the inhaler.   Rhinitis:  Started in childhood.   Symptoms include: nasal congestion, rhinorrhea, post nasal drainage, sneezing, watery eyes, and itchy eyes  Her sense of smell is intact.  Occurs year-round Potential triggers: not sure Treatments tried:  Tried OTC dollar tree nose spray, Claritin/Zyrtec, benadryl PRN  Previous allergy testing: no History of reflux/heartburn: no History of sinus surgery: no Nonallergic triggers: none    Past Medical History: Past Medical History:  Diagnosis Date   Abnormal Pap smear    HISTORY   Anxiety    NO MED   Asthma    AS A CHILD - NO INHALER-NO PROBLEMS   Bacterial vaginosis    HISTORY   Chlamydia    HISTORY   Depression    NO MED   Headache(784.0)    OTC MED PRN   HPV (human papilloma virus) infection    Missed abortion    X 2   Preterm labor    HISTORY WITH FIRST PREGNANCY   Sickle cell trait (HCC)    Termination of pregnancy (fetus)    X 1   Thrombocytopenia (HCC)    UTI (urinary tract infection)    HISTORY   Yeast vaginitis    HISTORY   Past Surgical History: Past Surgical History:  Procedure Laterality Date   CESAREAN SECTION  02/2009   CESAREAN SECTION  08/28/2011   Procedure: CESAREAN SECTION;  Surgeon: Esmeralda Arthur, MD;  Location: [facility];  Service: Gynecology;  Laterality: N/A;   CESAREAN SECTION N/A 05/12/2014   Procedure: CESAREAN  SECTION;  Surgeon: Michael Litter, MD;  Location: [facility];  Service: Obstetrics;  Laterality: N/A;   EYE SURGERY     CLOGGED DUCT - LEFT   EYE SURGERY     TOOTH EXTRACTION      Family History: Family History  Problem Relation Age of Onset   Cancer Paternal Uncle    Cancer Maternal Grandmother    Cancer Paternal Grandfather    Anesthesia problems Neg Hx     Social History:  Lives in a unknown year townhouse Flooring in bedroom: laminate Pets: none Tobacco use/exposure: smokes marijuana daily, no cigarettes Job: none  Medication List:  Allergies as of 08/11/2022       Reactions   Other Hives   Sulfa Antibiotics Hives, Itching        Medication List        Accurate as of August 11, 2022 11:32 AM. If you have any questions, ask your nurse or doctor.          STOP taking these medications    ibuprofen 600 MG tablet Commonly known as: ADVIL Stopped by: Birder Robson, MD   loratadine 10 MG tablet Commonly known as: CLARITIN Stopped by: Birder Robson, MD   oxyCODONE-acetaminophen 5-325 MG tablet Commonly known as: PERCOCET/ROXICET Stopped by: [name]  [name], MD   prenatal multivitamin Tabs tablet Stopped by: Birder Robson, MD       TAKE these medications    FLUoxetine 20 MG capsule Commonly known as: PROZAC Take 1 capsule by mouth daily.   lamoTRIgine 100 MG tablet Commonly known as: LAMICTAL Take 1 tablet by mouth daily.   Mirena (52 MG) 20 MCG/DAY Iud Generic drug: levonorgestrel 1 each by Intrauterine route once.   ProAir HFA 108 (90 Base) MCG/ACT inhaler Generic drug: albuterol Inhale 1-2 puffs into the lungs as needed for wheezing or shortness of breath.   Vyvanse 30 MG capsule Generic drug: lisdexamfetamine Take 1 capsule by mouth every morning.         REVIEW OF SYSTEMS: Pertinent positives and negatives discussed in HPI.   Objective:   Physical Exam: BP 104/68   Pulse 64   Temp 98 F (36.7 C) (Temporal)   Resp 16    Ht 5\' 6"  (1.676 m)   Wt 220 lb (99.8 kg)   SpO2 98%   BMI 35.51 kg/m  Body mass index is 35.51 kg/m. GEN: alert, well developed HEENT: clear conjunctiva, TM grey and translucent, nose with + inferior turbinate hypertrophy, pink nasal mucosa, + clear rhinorrhea, + cobblestoning HEART: regular rate and rhythm, no murmur LUNGS: clear to auscultation bilaterally, no coughing, unlabored respiration ABDOMEN: soft, non distended  SKIN: no rashes or lesions  Reviewed:  05/18/2022: seen by Dr. 07/18/2022 for seasonal allergic rhinitis, referred to Allergy, on Flonase/Hydroxyzine  06/2016: seen in ER for hives, prescribed Claritin PRN.  05/29/2011: seen by 05/31/2011 for asthma during pregnancy.  There was concern it was exacerbated by environmental/seasonal allergies. On Singulair and Albuterol PRN.   Skin Testing:  Skin prick testing was placed, which includes aeroallergens/foods, histamine control, and saline control.  Verbal consent was obtained prior to placing test.  Patient tolerated procedure well.  Allergy testing results were read and interpreted by myself, documented by clinical staff. Adequate positive and negative control.  Results discussed with patient/family.  Airborne Adult Perc - 08/11/22 1000     Time Antigen Placed 1050    Allergen Manufacturer 08/13/22    Location Back    Number of Test 59    2. Control-Histamine 1 mg/ml 3+    4. Bahia 3+    5. Waynette Buttery 3+    6. Johnson 3+    7. Kentucky Blue 3+    8. Meadow Fescue 3+    9. Perennial Rye 3+    10. Sweet Vernal 3+    11. Timothy 3+    12. Cocklebur Negative    13. Burweed Marshelder 3+    14. Ragweed, short Negative    15. Ragweed, Giant Negative    16. Plantain,  English Negative    17. Lamb's Quarters Negative    18. Sheep Sorrell 3+    19. Rough Pigweed 3+    21. Mugwort, Common Negative    22. Ash mix 2+    23. Birch mix 3+    24. Beech American 3+    25. Box, Elder Negative    26. Cedar, red Negative     27. Cottonwood, [place] Negative    28. Elm mix Negative    29. Hickory 3+    30. Maple mix Negative    31. Oak, Guinea[place] mix 3+    32. Pecan Pollen 3+    34. Sycamore Eastern Negative    35. Walnut, Black Pollen 3+  36. Alternaria alternata Negative    37. Cladosporium Herbarum Negative    38. Aspergillus mix Negative    39. Penicillium mix Negative    40. Bipolaris sorokiniana (Helminthosporium) Negative    41. Drechslera spicifera (Curvularia) Negative    42. Mucor plumbeus Negative    43. Fusarium moniliforme Negative    44. Aureobasidium pullulans (pullulara) Negative    45. Rhizopus oryzae Negative    46. Botrytis cinera Negative    47. Epicoccum nigrum Negative    48. Phoma betae Negative    49. Candida Albicans Negative    50. Trichophyton mentagrophytes Negative    51. Mite, D Farinae  5,000 AU/ml 3+    52. Mite, D Pteronyssinus  5,000 AU/ml Negative    53. Cat Hair 10,000 BAU/ml 3+    54.  Dog Epithelia Negative    55. Mixed Feathers Negative    56. Horse Epithelia Negative    57. Cockroach, German 3+    58. Mouse Negative    59. Tobacco Leaf Negative             Intradermal - 08/11/22 1058     Time Antigen Placed 1058    Allergen Manufacturer Waynette Buttery    Location Arm    Number of Test 7    Intradermal Select    Control Negative    Ragweed mix Negative    Mold 1 Negative    Mold 2 Negative    Mold 3 Negative    Mold 4 Negative    Dog 3+               Assessment:   1. Seasonal and perennial allergic rhinitis   2. Allergic conjunctivitis of both eyes     Plan/Recommendations:   Allergic Rhinitis Allergic Conjunctivitis  - Positive skin test 07/2022: trees, grasses, weeds, dust mite, cat, cockroach.   - Avoidance measures discussed. - Use nasal saline rinses before nose sprays such as with Neilmed Sinus Rinse.  Use distilled water.   - Use Flonase 2 sprays each nostril daily. Aim upward and outward. - Use Azelastine 1-2 sprays each  nostril twice daily as needed for runny nose, congestion, sneezing. Aim upward and outward. - Use Zyrtec 10 mg daily or Xyzal 5mg  daily.    - For eyes, use Olopatadine or Ketotifen 1 eye drop daily as needed for itchy, watery eyes.  Available over the Bryant, if not covered by insurance.  - Consider allergy shots as long term control of your symptoms by teaching your immune system to be more tolerant of your allergy triggers  Return in about 6 weeks (around 09/22/2022).  11/21/2022, MD [facility]

## 2022-09-19 ENCOUNTER — Ambulatory Visit (INDEPENDENT_AMBULATORY_CARE_PROVIDER_SITE_OTHER): Payer: Medicaid Other | Admitting: Internal Medicine

## 2022-09-19 ENCOUNTER — Other Ambulatory Visit: Payer: Self-pay

## 2022-09-19 ENCOUNTER — Encounter: Payer: Self-pay | Admitting: Internal Medicine

## 2022-09-19 VITALS — BP 120/72 | HR 88 | Temp 98.0°F | Resp 20 | Ht 64.0 in | Wt 217.0 lb

## 2022-09-19 DIAGNOSIS — J3089 Other allergic rhinitis: Secondary | ICD-10-CM

## 2022-09-19 DIAGNOSIS — J302 Other seasonal allergic rhinitis: Secondary | ICD-10-CM

## 2022-09-19 DIAGNOSIS — H1013 Acute atopic conjunctivitis, bilateral: Secondary | ICD-10-CM | POA: Diagnosis not present

## 2022-09-19 NOTE — Patient Instructions (Addendum)
Allergic Rhinitis Allergic Conjunctivitis  - Positive skin test 07/2022: trees, grasses, weeds, dust mite, cat, cockroach.   - Avoidance measures discussed. - Use nasal saline rinses before nose sprays such as with Neilmed Sinus Rinse.  Use distilled water.   - Use Flonase 2 sprays each nostril daily. Aim upward and outward. - Use Azelastine 1-2 sprays each nostril twice daily as needed for runny nose, congestion, sneezing. Aim upward and outward. - Use Zyrtec 10 mg daily or Xyzal 5mg  daily.    - For eyes, use Olopatadine or Ketotifen 1 eye drop daily as needed for itchy, watery eyes.  Available over the counter, if not covered by insurance.  - Consider allergy shots as long term control of your symptoms by teaching your immune system to be more tolerant of your allergy triggers. Given information on allergy shots today.

## 2022-09-19 NOTE — Progress Notes (Signed)
   FOLLOW UP Date of Service/Encounter:  09/19/22   Subjective:  [name] (DOB: May 06, 1986) is a 37 y.o. female who returns to the [facility] [place] on 09/19/2022 for follow up for allergic rhinitis.   History obtained from: chart review and patient. Last visit was with me 08/11/2022 for allergic rhinoconjunctivitis and was positive to trees, grasses, weeds, DM, cat, cockroach.  Started on Flonase, PRN Azelastine/Zyrtec/Olopatadine eye drops.  Since last visit, she reports still having trouble with congestion, drainage and runny nose. Not much eye symptoms recently.  She is using Zyrtec daily. She tried the Azelastine but she did not like the taste.  She did not use Flonase.    Past Medical History: Past Medical History:  Diagnosis Date   Abnormal Pap smear    HISTORY   Anxiety    NO MED   Asthma    AS A CHILD - NO INHALER-NO PROBLEMS   Bacterial vaginosis    HISTORY   Chlamydia    HISTORY   Depression    NO MED   Headache(784.0)    OTC MED PRN   HPV (human papilloma virus) infection    Missed abortion    X 2   Preterm labor    HISTORY WITH FIRST PREGNANCY   Sickle cell trait (HCC)    Termination of pregnancy (fetus)    X 1   Thrombocytopenia (HCC)    UTI (urinary tract infection)    HISTORY   Yeast vaginitis    HISTORY    Objective:  BP 120/72   Pulse 88   Temp 98 F (36.7 C)   Resp 20   Ht 5\' 4"  (1.626 m)   Wt 217 lb (98.4 kg)   SpO2 98%   BMI 37.25 kg/m  Body mass index is 37.25 kg/m. Physical Exam: GEN: alert, well developed HEENT: clear conjunctiva, TM grey and translucent, nose with moderate inferior turbinate hypertrophy, pale nasal mucosa, clear rhinorrhea, no cobblestoning HEART: regular rate and rhythm, no murmur LUNGS: clear to auscultation bilaterally, no coughing, unlabored respiration SKIN: no rashes or lesions   Assessment:   1. Seasonal and perennial allergic rhinitis   2. Allergic conjunctivitis of both eyes      Plan/Recommendations:   Allergic Rhinitis Allergic Conjunctivitis  - Remains uncontrolled, will add INCS and given information on AIT.  - Positive skin test 07/2022: trees, grasses, weeds, dust mite, cat, cockroach.   - Avoidance measures discussed. - Use nasal saline rinses before nose sprays such as with Neilmed Sinus Rinse.  Use distilled water.   - Use Flonase 2 sprays each nostril daily. Aim upward and outward. - Use Azelastine 1-2 sprays each nostril twice daily as needed for runny nose, congestion, sneezing. Aim upward and outward. Lean forward when you use it.  - Use Zyrtec 10 mg daily or Xyzal 5mg  daily.    - For eyes, use Olopatadine or Ketotifen 1 eye drop daily as needed for itchy, watery eyes.  Available over the counter, if not covered by insurance.  - Consider allergy shots as long term control of your symptoms by teaching your immune system to be more tolerant of your allergy triggers. Given information on allergy shots today.        Return in about 2 months (around 11/18/2022).  Harlon Flor, MD [facility]

## 2022-10-06 ENCOUNTER — Ambulatory Visit (HOSPITAL_COMMUNITY)
Admission: EM | Admit: 2022-10-06 | Discharge: 2022-10-06 | Disposition: A | Discharge status: Home / Self Care | Payer: Medicaid Other | Attending: Family Medicine | Admitting: Family Medicine

## 2022-10-06 ENCOUNTER — Encounter: Payer: Self-pay | Admitting: Oncology

## 2022-10-06 ENCOUNTER — Ambulatory Visit (INDEPENDENT_AMBULATORY_CARE_PROVIDER_SITE_OTHER): Payer: Medicaid Other

## 2022-10-06 ENCOUNTER — Encounter (HOSPITAL_COMMUNITY): Payer: Self-pay

## 2022-10-06 DIAGNOSIS — M25511 Pain in right shoulder: Secondary | ICD-10-CM

## 2022-10-06 DIAGNOSIS — M79601 Pain in right arm: Secondary | ICD-10-CM

## 2022-10-06 DIAGNOSIS — R200 Anesthesia of skin: Secondary | ICD-10-CM

## 2022-10-06 DIAGNOSIS — M542 Cervicalgia: Secondary | ICD-10-CM

## 2022-10-06 MED ORDER — KETOROLAC TROMETHAMINE 30 MG/ML IJ SOLN
30.0000 mg | Freq: Once | INTRAMUSCULAR | Status: AC
  Administered 2022-10-06: 30 mg via INTRAMUSCULAR

## 2022-10-06 MED ORDER — KETOROLAC TROMETHAMINE 30 MG/ML IJ SOLN
INTRAMUSCULAR | Status: AC
  Filled 2022-10-06: qty 1

## 2022-10-06 MED ORDER — CYCLOBENZAPRINE HCL 10 MG PO TABS: 10.0000 mg | ORAL_TABLET | Freq: Two times a day (BID) | ORAL | 0 refills | Status: AC | PRN

## 2022-10-06 MED ORDER — PREDNISONE 20 MG PO TABS: 40.0000 mg | ORAL_TABLET | Freq: Every day | ORAL | 0 refills | Status: AC

## 2022-10-06 NOTE — ED Triage Notes (Signed)
Chief Complaint: right shoulder and arm pain. Patient states she lifted something heavy. Reduced range of motion  I the right arm   Onset: 1 week.   Prescriptions or OTC medications tried: Yes- otc pain meds     with no relief

## 2022-10-06 NOTE — Discharge Instructions (Addendum)
You were seen today for arm pain and numbness.  The shoulder xray was normal.  The neck xray did show some arthritis.  You likely are having muscle spasms and a pinched nerve.  I have sent out a muscle relaxer, which you should take when home and not driving as this may make you tired/sleepy.  I have also sent out prednisone to take daily x 5 days.  If you feel like this is may cause a manic episode please stop immediately.  Please use heat or ice on the back/neck/shoulder as well to see if helpful.  Your should follow up with your primary care provider if not improving.

## 2022-10-06 NOTE — ED Provider Notes (Signed)
[place]    CSN: [id number] Arrival date & time: 10/06/22  1246      History   Chief Complaint Chief Complaint  Patient presents with   Shoulder Pain    HPI [name] is a 37 y.o. female.   Patient is here for right arm pain.  Pain started about 2-3 weeks ago. No known injury.  It started in the shoulder.  Continues with pain in the right neck and shoulder, having numbness into the elbow.  Nothing makes it feel better.  Keeping her up at night.  No h/o neck or cervical disk issues.  Taking something otc, not sure, but also not helping much.        Past Medical History:  Diagnosis Date   Abnormal Pap smear    HISTORY   Anxiety    NO MED   Asthma    AS A CHILD - NO INHALER-NO PROBLEMS   Bacterial vaginosis    HISTORY   Chlamydia    HISTORY   Depression    NO MED   Headache(784.0)    OTC MED PRN   HPV (human papilloma virus) infection    Missed abortion    X 2   Preterm labor    HISTORY WITH FIRST PREGNANCY   Sickle cell trait (HCC)    Termination of pregnancy (fetus)    X 1   Thrombocytopenia (HCC)    UTI (urinary tract infection)    HISTORY   Yeast vaginitis    HISTORY    Patient Active Problem List   Diagnosis Date Noted   Status post repeat low transverse cesarean section x 2 08/31/2011   Asthma 07/18/2011   Sickle cell trait ([place]) 07/18/2011   ITP (idiopathic thrombocytopenic purpura) 07/18/2011    Past Surgical History:  Procedure Laterality Date   CESAREAN SECTION  02/2009   CESAREAN SECTION  08/28/2011   Procedure: CESAREAN SECTION;  Surgeon: Alwyn Pea, MD;  Location: [facility];  Service: Gynecology;  Laterality: N/A;   CESAREAN SECTION N/A 05/12/2014   Procedure: CESAREAN SECTION;  Surgeon: Betsy Coder, MD;  Location: [facility];  Service: Obstetrics;  Laterality: N/A;   EYE SURGERY     CLOGGED DUCT - LEFT   EYE SURGERY     TOOTH EXTRACTION      OB History     Gravida  6   Para  3   Term  2    Preterm  1   AB  3   Living  3      SAB  2   IAB  1   Ectopic      Multiple      Live Births  3            Home Medications    Prior to Admission medications   Medication Sig Start Date End Date Taking? Authorizing Provider  albuterol (PROAIR HFA) 108 (90 Base) MCG/ACT inhaler Inhale 1-2 puffs into the lungs as needed for wheezing or shortness of breath. 04/10/20   [provider]  azelastine (ASTELIN) 0.1 % nasal spray Place 1 spray into both nostrils 2 (two) times daily. Use in each nostril as directed Patient not taking: Reported on 09/19/2022 08/11/22   Larose Kells, MD  cetirizine (ZYRTEC ALLERGY) 10 MG tablet Take 1 tablet (10 mg total) by mouth daily. Patient not taking: Reported on 09/19/2022 08/11/22   Larose Kells, MD  FLUoxetine (PROZAC) 20 MG capsule Take 1  capsule by mouth daily.    [provider]  fluticasone (FLONASE) 50 MCG/ACT nasal spray Place 2 sprays into both nostrils daily. Patient not taking: Reported on 09/19/2022 08/11/22   Larose Kells, MD  lamoTRIgine (LAMICTAL) 100 MG tablet Take 1 tablet by mouth daily.    [provider]  levonorgestrel (MIRENA, 52 MG,) 20 MCG/DAY IUD 1 each by Intrauterine route once. 05/10/21   [provider]  Olopatadine HCl 0.2 % SOLN Apply 1 drop to eye daily as needed (itchy watery eyes). Patient not taking: Reported on 09/19/2022 08/11/22   Larose Kells, MD  VYVANSE 30 MG capsule Take 1 capsule by mouth every morning.    [provider]    Family History Family History  Problem Relation Age of Onset   Cancer Paternal Uncle    Cancer Maternal Grandmother    Cancer Paternal Grandfather    Anesthesia problems Neg Hx     Social History Social History   Tobacco Use   Smoking status: Never    Passive exposure: Never   Smokeless tobacco: Never  Vaping Use   Vaping Use: Never used  Substance Use Topics   Alcohol use: No    Comment: none with preg   Drug use: No      Allergies   Other and Sulfa antibiotics   Review of Systems Review of Systems  Constitutional: Negative.   HENT: Negative.    Respiratory: Negative.    Cardiovascular: Negative.   Gastrointestinal: Negative.   Musculoskeletal:  Positive for neck pain.  Psychiatric/Behavioral: Negative.       Physical Exam Triage Vital Signs ED Triage Vitals  Enc Vitals Group     BP 10/06/22 1438 (!) 145/106     Pulse Rate 10/06/22 1438 84     Resp 10/06/22 1438 16     Temp 10/06/22 1438 98.1 F (36.7 C)     Temp Source 10/06/22 1438 Oral     SpO2 --      Weight --      Height --      Head Circumference --      Peak Flow --      Pain Score 10/06/22 1437 10     Pain Loc --      Pain Edu? --      Excl. in [place]? --    No data found.  Updated Vital Signs BP (!) 145/106 (BP Location: Left Wrist)   Pulse 84   Temp 98.1 F (36.7 C) (Oral)   Resp 16   LMP  (LMP Unknown)   Breastfeeding No   Visual Acuity Right Eye Distance:   Left Eye Distance:   Bilateral Distance:    Right Eye Near:   Left Eye Near:    Bilateral Near:     Physical Exam Constitutional:      Appearance: Normal appearance.     Comments: Hold the right arm above her head for comfort  Cardiovascular:     Rate and Rhythm: Normal rate.  Pulmonary:     Effort: Pulmonary effort is normal.  Musculoskeletal:     Comments: + ttp to the cervical spine and right upper back/neck area;  ttp to the posterior and anterior shoulder;  + muscle fasiculations to the back of the right shoulder;   Full rom of the neck and shoulder;  Spurlings +on the right Normal strength;  no weakness  Skin:    General: Skin is warm.  Neurological:  General: No focal deficit present.     Mental Status: She is alert.  Psychiatric:        Mood and Affect: Mood normal.      UC Treatments / Results  Labs (all labs ordered are listed, but only abnormal results are displayed) Labs Reviewed - No data to  display  EKG   Radiology DG Shoulder Right  Result Date: 10/06/2022 CLINICAL DATA:  Right shoulder and arm pain. Patient states she lifted something heavy. Reduced range of motion. EXAM: RIGHT SHOULDER - 2+ VIEW COMPARISON:  None available FINDINGS: No fracture or dislocation. Soft tissues are unremarkable. IMPRESSION: Normal radiographic appearance of the right shoulder. Electronically Signed   By: Miachel Roux M.D.   On: 10/06/2022 15:28   DG Cervical Spine Complete  Result Date: 10/06/2022 CLINICAL DATA:  Neck and shoulder pain and numbness. No history of trauma reported EXAM: CERVICAL SPINE - COMPLETE 5 VIEW COMPARISON:  None Available. FINDINGS: Loss of normal cervical lordosis which can be related to patient position or muscle spasm. Preserved vertebral body height and bone mineralization. Preserved prevertebral soft tissues. Slight disc height loss at C5-6. No osseous neural foraminal stenosis on the oblique views. Recommend continue precautions until clinical clearance and if there is further concern additional cross-sectional imaging study can be performed as clinically directed to assess for occult abnormality. Not all acute cervical spine pathology is immediately visible by x-ray IMPRESSION: Loss of normal cervical lordosis. Slight degenerative changes at C5-6 Electronically Signed   By: Jill Side M.D.   On: 10/06/2022 15:28    Procedures Procedures (including critical care time)  Medications Ordered in UC Medications - No data to display  Initial Impression / Assessment and Plan / UC Course  I have reviewed the triage vital signs and the nursing notes.  Pertinent labs & imaging results that were available during my care of the patient were reviewed by me and considered in my medical decision making (see chart for details).    Final Clinical Impressions(s) / UC Diagnoses   Final diagnoses:  Right arm pain  Acute pain of right shoulder  Right arm numbness     Discharge  Instructions      You were seen today for arm pain and numbness.  The shoulder xray was normal.  The neck xray did show some arthritis.  You likely are having muscle spasms and a pinched nerve.  I have sent out a muscle relaxer, which you should take when home and not driving as this may make you tired/sleepy.  I have also sent out prednisone to take daily x 5 days.  If you feel like this is may cause a manic episode please stop immediately.  Please use heat or ice on the back/neck/shoulder as well to see if helpful.  Your should follow up with your primary care provider if not improving.     ED Prescriptions     Medication Sig Dispense Auth. Provider   cyclobenzaprine (FLEXERIL) 10 MG tablet Take 1 tablet (10 mg total) by mouth 2 (two) times daily as needed for muscle spasms. 20 tablet Brylon Brenning, MD   predniSONE (DELTASONE) 20 MG tablet Take 2 tablets (40 mg total) by mouth daily for 5 days. 10 tablet Rondel Oh, MD      PDMP not reviewed this encounter.   Rondel Oh, MD 10/06/22 [phone]

## 2022-10-12 ENCOUNTER — Telehealth: Payer: Self-pay

## 2022-10-12 NOTE — Telephone Encounter (Signed)
Patient called in - DOB verified - complaining of a dry cough that started last week. Patient stated she has been using OTC Regular Mucinex with some relief but lose her voice on and off. Patient stated she has also been using her daughter's inhaler which did help some with a productive cough.  Patient was advised to try OTC Mucinex DM or Delsym; Hot tea w/ lemon and/or honey to help soothe her throat and calm the cough.  Patient advised if her cough doesn't get any better by Monday to contact the office to be seen.  Patient verbalized understanding, no further questions.

## 2022-11-28 ENCOUNTER — Ambulatory Visit (INDEPENDENT_AMBULATORY_CARE_PROVIDER_SITE_OTHER): Payer: Medicaid Other | Admitting: Internal Medicine

## 2022-11-28 ENCOUNTER — Encounter: Payer: Self-pay | Admitting: Internal Medicine

## 2022-11-28 ENCOUNTER — Other Ambulatory Visit: Payer: Self-pay

## 2022-11-28 VITALS — BP 118/74 | HR 80 | Temp 98.2°F | Resp 16 | Wt 218.1 lb

## 2022-11-28 DIAGNOSIS — J302 Other seasonal allergic rhinitis: Secondary | ICD-10-CM | POA: Diagnosis not present

## 2022-11-28 DIAGNOSIS — J3089 Other allergic rhinitis: Secondary | ICD-10-CM

## 2022-11-28 DIAGNOSIS — H1013 Acute atopic conjunctivitis, bilateral: Secondary | ICD-10-CM | POA: Diagnosis not present

## 2022-11-28 MED ORDER — AZELASTINE HCL 0.1 % NA SOLN: 1.0000 | Freq: Two times a day (BID) | NASAL | 5 refills | Status: AC | PRN

## 2022-11-28 MED ORDER — LEVOCETIRIZINE DIHYDROCHLORIDE 5 MG PO TABS: 5.0000 mg | ORAL_TABLET | Freq: Every evening | ORAL | 5 refills | Status: AC

## 2022-11-28 MED ORDER — FLUTICASONE PROPIONATE 50 MCG/ACT NA SUSP: 2.0000 | Freq: Every day | NASAL | 5 refills | Status: AC

## 2022-11-28 MED ORDER — OLOPATADINE HCL 0.2 % OP SOLN: 1.0000 [drp] | Freq: Every day | OPHTHALMIC | 5 refills | Status: AC | PRN

## 2022-11-28 NOTE — Patient Instructions (Signed)
Allergic Rhinitis Allergic Conjunctivitis  - Positive skin test 07/2022: trees, grasses, weeds, dust mite, cat, cockroach.   - Avoidance measures discussed. - Use nasal saline rinses before nose sprays such as with Neilmed Sinus Rinse.  Use distilled water.   - Use Flonase 2 sprays each nostril daily. Aim upward and outward. - Use Azelastine 1-2 sprays each nostril twice daily as needed for runny nose, congestion, sneezing. Aim upward and outward. - Use Zyrtec 10 mg daily or Xyzal  daily.    - For eyes, use Olopatadine or Ketotifen 1 eye drop daily as needed for itchy, watery eyes.  Available over the counter, if not covered by insurance.  - Consider allergy shots as long term control of your symptoms by teaching your immune system to be more tolerant of your allergy triggers.

## 2022-11-28 NOTE — Progress Notes (Signed)
   FOLLOW UP Date of Service/Encounter:  11/28/22   Subjective:  [name] (DOB: 08/27/1985) is a 37 y.o. female who [facility] for follow up for allergic rhinoconjunctivitis.   History obtained from: chart review and patient. Last visit was with me 09/19/2022 and was uncontrolled so discussed AIT and given information. On Flonase/Azelastine/OAH/Olopatadine.   Since last visit, she reports doing okay. Still has some congestion, runny nose and drainage especially with the start of Spring.  She does better when she remembers to take her Flonase but does forget many times.  Not using her Azelastine.  Also taking Xyzal daily. Rarely needs olopatadine.  Not interested in AIT at this time.   Past Medical History: Past Medical History:  Diagnosis Date   Abnormal Pap smear    HISTORY   Anxiety    NO MED   Asthma    AS A CHILD - NO INHALER-NO PROBLEMS   Bacterial vaginosis    HISTORY   Chlamydia    HISTORY   Depression    NO MED   Headache(784.0)    OTC MED PRN   HPV (human papilloma virus) infection    Missed abortion    X 2   Preterm labor    HISTORY WITH FIRST PREGNANCY   Sickle cell trait    Termination of pregnancy (fetus)    X 1   Thrombocytopenia    UTI (urinary tract infection)    HISTORY   Yeast vaginitis    HISTORY    Objective:  BP 118/74   Pulse 80   Temp 98.2 F (36.8 C) (Temporal)   Resp 16   Wt 218 lb 1.6 oz (98.9 kg)   SpO2 97%   BMI 37.44 kg/m  Body mass index is 37.44 kg/m. Physical Exam: GEN: alert, well developed HEENT: clear conjunctiva, nose with moderate inferior turbinate hypertrophy, pale nasal mucosa, clear rhinorrhea, no cobblestoning HEART: regular rate and rhythm, no murmur LUNGS: clear to auscultation bilaterally, no coughing, unlabored respiration SKIN: no rashes or lesions   Assessment:   1. Seasonal and perennial allergic rhinitis   2. Allergic conjunctivitis of both eyes      Plan/Recommendations:   Allergic Rhinitis Allergic Conjunctivitis  - Not well controlled; discussed AIT but not interested at this time.  Also discussed adding INAH to INCS for maximal benefit.  - Positive skin test 07/2022: trees, grasses, weeds, dust mite, cat, cockroach.   - Avoidance measures discussed. - Use nasal saline rinses before nose sprays such as with Neilmed Sinus Rinse.  Use distilled water.   - Use Flonase 2 sprays each nostril daily. Aim upward and outward. - Use Azelastine 1-2 sprays each nostril twice daily as needed for runny nose, congestion, sneezing. Aim upward and outward. - Use Zyrtec 10 mg daily or Xyzal  daily.    - For eyes, use Olopatadine or Ketotifen 1 eye drop daily as needed for itchy, watery eyes.  Available over the Bryant, if not covered by insurance.  - Consider allergy shots as long term control of your symptoms by teaching your immune system to be more tolerant of your allergy triggers.     Return in about 6 months (around 05/30/2023).  Alesia Morin, MD [facility]
